# Patient Record
Sex: Female | Born: 1995 | Race: Black or African American | Hispanic: No | Marital: Single | Smoking: Never smoker
Health system: Southern US, Community
[De-identification: ages and names within clinical notes are randomized; demographics above are authoritative.]

## PROBLEM LIST (undated history)

## (undated) DIAGNOSIS — F419 Anxiety disorder, unspecified: Secondary | ICD-10-CM

## (undated) DIAGNOSIS — T7840XA Allergy, unspecified, initial encounter: Secondary | ICD-10-CM

## (undated) DIAGNOSIS — S62609A Fracture of unspecified phalanx of unspecified finger, initial encounter for closed fracture: Secondary | ICD-10-CM

## (undated) DIAGNOSIS — F32A Depression, unspecified: Secondary | ICD-10-CM

## (undated) HISTORY — DX: Allergy, unspecified, initial encounter: T78.40XA

## (undated) HISTORY — DX: Depression, unspecified: F32.A

## (undated) HISTORY — DX: Fracture of unspecified phalanx of unspecified finger, initial encounter for closed fracture: S62.609A

## (undated) HISTORY — DX: Anxiety disorder, unspecified: F41.9

---
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
## 2017-02-24 ENCOUNTER — Encounter: Payer: Self-pay | Admitting: Family Medicine

## 2017-02-24 ENCOUNTER — Ambulatory Visit (INDEPENDENT_AMBULATORY_CARE_PROVIDER_SITE_OTHER): Payer: PRIVATE HEALTH INSURANCE | Admitting: Family Medicine

## 2017-02-24 VITALS — BP 96/60 | HR 72 | Ht 61.0 in | Wt 127.8 lb

## 2017-02-24 DIAGNOSIS — N3001 Acute cystitis with hematuria: Secondary | ICD-10-CM

## 2017-02-24 DIAGNOSIS — R109 Unspecified abdominal pain: Secondary | ICD-10-CM | POA: Diagnosis not present

## 2017-02-24 DIAGNOSIS — N898 Other specified noninflammatory disorders of vagina: Secondary | ICD-10-CM

## 2017-02-24 DIAGNOSIS — N760 Acute vaginitis: Secondary | ICD-10-CM

## 2017-02-24 DIAGNOSIS — B9689 Other specified bacterial agents as the cause of diseases classified elsewhere: Secondary | ICD-10-CM

## 2017-02-24 DIAGNOSIS — Z7251 High risk heterosexual behavior: Secondary | ICD-10-CM

## 2017-02-24 LAB — POCT WET PREP (WET MOUNT): TRICHOMONAS WET PREP HPF POC: ABSENT

## 2017-02-24 LAB — POCT URINALYSIS DIPSTICK
Bilirubin, UA: NEGATIVE
Glucose, UA: NEGATIVE
KETONES UA: NEGATIVE
NITRITE UA: NEGATIVE
SPEC GRAV UA: 1.025 (ref 1.010–1.025)
UROBILINOGEN UA: NEGATIVE U/dL — AB
pH, UA: 6 (ref 5.0–8.0)

## 2017-02-24 MED ORDER — METRONIDAZOLE 0.75 % VA GEL: 1.0000 | Freq: Every day | VAGINAL | 0 refills | Status: DC

## 2017-02-24 MED ORDER — NITROFURANTOIN MONOHYD MACRO 100 MG PO CAPS: 100.0000 mg | ORAL_CAPSULE | Freq: Two times a day (BID) | ORAL | 0 refills | Status: DC

## 2017-02-24 NOTE — Patient Instructions (Addendum)
Take the antibiotic pill twice daily for 5 days.  This is to treat the bladder infection.  If symptoms worsen, please contact us. You should feel significantly better after 2 days of antibiotics. Use the vaginal medication once nightly for 5 nights. If you develop thick, white discharge, with itching, you may be getting a yeast infection from the antibiotic, and let us know.   Make sure you use back-up contraception.  Any antibiotic can interfere with the birth control pills. Condoms are highly recommended anyway, for regular use, for prevention of STD's.   Consider returning for HIV and syphillis tests as part of routine STD testing. You wanted to check with your insurance first.  There are orders in the computer--you would only need to schedule a lab visit.     Bacterial Vaginosis Bacterial vaginosis is a vaginal infection that occurs when the normal balance of bacteria in the vagina is disrupted. It results from an overgrowth of certain bacteria. This is the most common vaginal infection among women ages 65-44. Because bacterial vaginosis increases your risk for STIs (sexually transmitted infections), getting treated can help reduce your risk for chlamydia, gonorrhea, herpes, and HIV (human immunodeficiency virus). Treatment is also important for preventing complications in pregnant women, because this condition can cause an early (premature) delivery. What are the causes? This condition is caused by an increase in harmful bacteria that are normally present in small amounts in the vagina. However, the reason that the condition develops is not fully understood. What increases the risk? The following factors may make you more likely to develop this condition:  Having a new sexual partner or multiple sexual partners.  Having unprotected sex.  Douching.  Having an intrauterine device (IUD).  Smoking.  Drug and alcohol abuse.  Taking certain antibiotic medicines.  Being  pregnant. You cannot get bacterial vaginosis from toilet seats, bedding, swimming pools, or contact with objects around you. What are the signs or symptoms? Symptoms of this condition include:  Grey or white vaginal discharge. The discharge can also be watery or foamy.  A fish-like odor with discharge, especially after sexual intercourse or during menstruation.  Itching in and around the vagina.  Burning or pain with urination. Some women with bacterial vaginosis have no signs or symptoms. How is this diagnosed? This condition is diagnosed based on:  Your medical history.  A physical exam of the vagina.  Testing a sample of vaginal fluid under a microscope to look for a large amount of bad bacteria or abnormal cells. Your health care provider may use a cotton swab or a small wooden spatula to collect the sample. How is this treated? This condition is treated with antibiotics. These may be given as a pill, a vaginal cream, or a medicine that is put into the vagina (suppository). If the condition comes back after treatment, a second round of antibiotics may be needed. Follow these instructions at home: Medicines   Take over-the-counter and prescription medicines only as told by your health care provider.  Take or use your antibiotic as told by your health care provider. Do not stop taking or using the antibiotic even if you start to feel better. General instructions   If you have a female sexual partner, tell her that you have a vaginal infection. She should see her health care provider and be treated if she has symptoms. If you have a female sexual partner, he does not need treatment.  During treatment:  Avoid sexual activity until you finish  treatment.  Do not douche.  Avoid alcohol as directed by your health care provider.  Avoid breastfeeding as directed by your health care provider.  Drink enough water and fluids to keep your urine clear or pale yellow.  Keep the area  around your vagina and rectum clean.  Wash the area daily with warm water.  Wipe yourself from front to back after using the toilet.  Keep all follow-up visits as told by your health care provider. This is important. How is this prevented?  Do not douche.  Wash the outside of your vagina with warm water only.  Use protection when having sex. This includes latex condoms and dental dams.  Limit how many sexual partners you have. To help prevent bacterial vaginosis, it is best to have sex with just one partner (monogamous).  Make sure you and your sexual partner are tested for STIs.  Wear cotton or cotton-lined underwear.  Avoid wearing tight pants and pantyhose, especially during summer.  Limit the amount of alcohol that you drink.  Do not use any products that contain nicotine or tobacco, such as cigarettes and e-cigarettes. If you need help quitting, ask your health care provider.  Do not use illegal drugs. Where to find more information:  Centers for Disease Control and Prevention: SolutionApps.co.zawww.cdc.gov/std  American Sexual Health Association (ASHA): www.ashastd.org  U.S. Department of Health and Health and safety inspectorHuman Services, Office on Women's Health: ConventionalMedicines.siwww.womenshealth.gov/ or http://www.anderson-williamson.info/https://www.womenshealth.gov/a-z-topics/bacterial-vaginosis Contact a health care provider if:  Your symptoms do not improve, even after treatment.  You have more discharge or pain when urinating.  You have a fever.  You have pain in your abdomen.  You have pain during sex.  You have vaginal bleeding between periods. Summary  Bacterial vaginosis is a vaginal infection that occurs when the normal balance of bacteria in the vagina is disrupted.  Because bacterial vaginosis increases your risk for STIs (sexually transmitted infections), getting treated can help reduce your risk for chlamydia, gonorrhea, herpes, and HIV (human immunodeficiency virus). Treatment is also important for preventing complications in pregnant  women, because the condition can cause an early (premature) delivery.  This condition is treated with antibiotic medicines. These may be given as a pill, a vaginal cream, or a medicine that is put into the vagina (suppository). This information is not intended to replace advice given to you by your health care provider. Make sure you discuss any questions you have with your health care provider. Document Released: 10/07/2005 Document Revised: 06/22/2016 Document Reviewed: 06/22/2016 Elsevier Interactive Patient Education  2017 [employer]   Urinary Tract Infection, Adult A urinary tract infection (UTI) is an infection of any part of the urinary tract, which includes the kidneys, ureters, bladder, and urethra. These organs make, store, and get rid of urine in the body. UTI can be a bladder infection (cystitis) or kidney infection (pyelonephritis). What are the causes? This infection may be caused by fungi, viruses, or bacteria. Bacteria are the most common cause of UTIs. This condition can also be caused by repeated incomplete emptying of the bladder during urination. What increases the risk? This condition is more likely to develop if:  You ignore your need to urinate or hold urine for long periods of time.  You do not empty your bladder completely during urination.  You wipe back to front after urinating or having a bowel movement, if you are female.  You are uncircumcised, if you are female.  You are constipated.  You have a urinary catheter that stays in  place (indwelling).  You have a weak defense (immune) system.  You have a medical condition that affects your bowels, kidneys, or bladder.  You have diabetes.  You take antibiotic medicines frequently or for long periods of time, and the antibiotics no longer work well against certain types of infections (antibiotic resistance).  You take medicines that irritate your urinary tract.  You are exposed to chemicals that irritate  your urinary tract.  You are female. What are the signs or symptoms? Symptoms of this condition include:  Fever.  Frequent urination or passing small amounts of urine frequently.  Needing to urinate urgently.  Pain or burning with urination.  Urine that smells bad or unusual.  Cloudy urine.  Pain in the lower abdomen or back.  Trouble urinating.  Blood in the urine.  Vomiting or being less hungry than normal.  Diarrhea or abdominal pain.  Vaginal discharge, if you are female. How is this diagnosed? This condition is diagnosed with a medical history and physical exam. You will also need to provide a urine sample to test your urine. Other tests may be done, including:  Blood tests.  Sexually transmitted disease (STD) testing. If you have had more than one UTI, a cystoscopy or imaging studies may be done to determine the cause of the infections. How is this treated? Treatment for this condition often includes a combination of two or more of the following:  Antibiotic medicine.  Other medicines to treat less common causes of UTI.  Over-the-counter medicines to treat pain.  Drinking enough water to stay hydrated. Follow these instructions at home:  Take over-the-counter and prescription medicines only as told by your health care provider.  If you were prescribed an antibiotic, take it as told by your health care provider. Do not stop taking the antibiotic even if you start to feel better.  Avoid alcohol, caffeine, tea, and carbonated beverages. They can irritate your bladder.  Drink enough fluid to keep your urine clear or pale yellow.  Keep all follow-up visits as told by your health care provider. This is important.  Make sure to:  Empty your bladder often and completely. Do not hold urine for long periods of time.  Empty your bladder before and after sex.  Wipe from front to back after a bowel movement if you are female. Use each tissue one time when you  wipe. Contact a health care provider if:  You have back pain.  You have a fever.  You feel nauseous or vomit.  Your symptoms do not get better after 3 days.  Your symptoms go away and then return. Get help right away if:  You have severe back pain or lower abdominal pain.  You are vomiting and cannot keep down any medicines or water. This information is not intended to replace advice given to you by your health care provider. Make sure you discuss any questions you have with your health care provider. Document Released: 07/17/2005 Document Revised: 03/20/2016 Document Reviewed: 08/28/2015 Elsevier Interactive Patient Education  2017 [employer].

## 2017-02-24 NOTE — Progress Notes (Signed)
Chief Complaint  Patient presents with  . Vaginal Discharge    x 1 week. White in color, fishy smell. And itching. Also thinks she may have a UTI.-sees bllod when she wipes and also having abdominal pain/pressure while urinating.    She presents with complaint of white creamy vaginal discharge for a week or so, more than a normal discharge. There has been a fishy odor, seems similar to prior BV.  She was last treated for BV a few months ago with oral flagyl (from an urgent care). She reports the pill was "hard to get down"--large, and bad taste.   Denies any significant itch, but notes some irritation externally.  She is also complaining of some lower abdominal pressure when voiding.  She noticed some blood when she wiped today, but didn't see blood in the urine in the toilet.  Denies burning with urination.  She is having some urgency and frequency.  Feels similar to prior UTI--treated with bactrim in 06/2016  Sexually active--with current partner x 6 months.  On OCP's, doesn't use condoms.  Has not had STD testing since this relationship started.  PMH, PSH, SH reviewed and updated  Current Outpatient Prescriptions on File Prior to Visit  Medication Sig Dispense Refill  . norethindrone-ethinyl estradiol (LOESTRIN FE 1/20) 1-20 MG-MCG tablet Take 1 tablet by mouth daily. 1 Package 11   No current facility-administered medications on file prior to visit.    No Known Allergies  ROS: no fever, chills, headaches, URI or allergy symptoms, chest pain, shortness of breath, GI complaints, rash or other complaints, except as noted in HPI  PHYSICAL EXAM: BP 96/60 (BP Location: Left Arm, Patient Position: Sitting, Cuff Size: Normal)   Pulse 72   Ht 5\' 1"  (1.549 m)   Wt 127 lb 12.8 oz (58 kg)   LMP 02/11/2017   BMI 24.15 kg/m   Well developed, pleasant female in no distress Heart: regular rate and rhythm Lungs: clear bilaterally Back: no CVA tenderness Abdomen: soft, nontender, no  organomegaly or mass GU: normal external genitalia. BUS normal.  Cervix appears normal, without lesions.  Mild-mod amount of thin white discharge present, some thicker discharge noted more proximally. No cervical motion tenderness.  Uterus is normal. Minimal tenderness at right adnexa, no mass, guarding Psych: normal mood, affect, hygiene and grooming Neuro: alert and oriented, cranial nerves, strength and gait normal.  Urine dip: SG 1.025; 3+ blood, 1+ protein, 2+ blood Wet prep-- many squams, rare clue, no yeast.   ASSESSMENT/PLAN:  Vaginal discharge - only few clue cells, but history is consistent--treat for BV. Risks/side effects reviewed. No other infection noted. Condom use recommended - Plan: POCT Wet Prep ([place]et [place]), GC/Chlamydia Probe Amp  Abdominal pressure - Plan: POCT Urinalysis Dipstick  Unprotected sexual intercourse - return for blood portion of STD screen (wants to check insurance first)--strongly encouraged condom use - Plan: GC/Chlamydia Probe Amp, RPR, HIV antibody  Acute cystitis with hematuria - treat with Macrobid. back up contraception discussed. Call if not improving - Plan: Urine culture, nitrofurantoin, macrocrystal-monohydrate, (MACROBID) 100 MG capsule  Bacterial vaginosis - Plan: metroNIDAZOLE (METROGEL VAGINAL) 0.75 % vaginal gel  HIV and RPR recommended--declines today, will check to ensure that it is covered (no deductible), and return for lab visit if covered.  Counseled re: condom use, risks of unprotected sex. Counseled re: risks/side effects of both prescribed medications.

## 2017-02-25 LAB — GC/CHLAMYDIA PROBE AMP
CT Probe RNA: NOT DETECTED
GC Probe RNA: NOT DETECTED

## 2017-02-26 LAB — URINE CULTURE

## 2017-05-27 ENCOUNTER — Other Ambulatory Visit: Payer: Self-pay | Admitting: Family Medicine

## 2017-05-27 DIAGNOSIS — N760 Acute vaginitis: Principal | ICD-10-CM

## 2017-05-27 DIAGNOSIS — B9689 Other specified bacterial agents as the cause of diseases classified elsewhere: Secondary | ICD-10-CM

## 2017-05-27 NOTE — Telephone Encounter (Signed)
Noted, one was ignored.  Please offer OV. She was to return for blood work (HIV/RPR) after verifying insurance coverage, but never returned.  Have her check on this, and we can schedule OV to address her concerns, and do bloodwork

## 2017-05-27 NOTE — Telephone Encounter (Signed)
Pt called and stated that she accidentally sent in request twice.

## 2017-06-23 ENCOUNTER — Other Ambulatory Visit: Payer: Self-pay | Admitting: Family Medicine

## 2017-06-23 DIAGNOSIS — Z3009 Encounter for other general counseling and advice on contraception: Secondary | ICD-10-CM

## 2017-06-24 NOTE — Telephone Encounter (Signed)
Left message for pt to call me back or reply to [employer] [facility] message

## 2017-07-03 ENCOUNTER — Telehealth: Payer: Self-pay | Admitting: Family Medicine

## 2017-07-03 DIAGNOSIS — Z3009 Encounter for other general counseling and advice on contraception: Secondary | ICD-10-CM

## 2017-07-03 MED ORDER — NORETHIN ACE-ETH ESTRAD-FE 1-20 MG-MCG PO TABS: 1.0000 | ORAL_TABLET | Freq: Every day | ORAL | 0 refills | Status: DC

## 2017-07-03 NOTE — Telephone Encounter (Signed)
She is my pt and can be a second PE (as long as just CPE, and not a list of concerns/issues also to be addressed).  Can send to nurse to refill OCP for just enough packs to get her to the date of physical

## 2017-07-03 NOTE — Telephone Encounter (Signed)
Done

## 2017-07-03 NOTE — Telephone Encounter (Signed)
Pt called and is needing a cpe,to refill her birth control pills  I am just wanting to make sure she is your pt, she had one with you 09-18-2015 and canceled, and she canceled her appt with the obyn, she can be reached at [phone]  Can she be added as one of your second cpe of the day

## 2017-07-03 NOTE — Telephone Encounter (Signed)
Pt is coming in sept 26th for cpe, she does need refill on her birth control she does not have any, states the names of the rx is junel FE 1/20 pt uses [facility] on [address]

## 2017-07-11 ENCOUNTER — Encounter: Payer: Self-pay | Admitting: Medical

## 2017-07-11 ENCOUNTER — Ambulatory Visit (INDEPENDENT_AMBULATORY_CARE_PROVIDER_SITE_OTHER): Payer: PRIVATE HEALTH INSURANCE | Admitting: Medical

## 2017-07-11 VITALS — BP 110/68 | HR 89 | Temp 98.8°F | Wt 122.0 lb

## 2017-07-11 DIAGNOSIS — R102 Pelvic and perineal pain unspecified side: Secondary | ICD-10-CM

## 2017-07-11 DIAGNOSIS — M545 Low back pain, unspecified: Secondary | ICD-10-CM

## 2017-07-11 DIAGNOSIS — N946 Dysmenorrhea, unspecified: Secondary | ICD-10-CM

## 2017-07-11 DIAGNOSIS — Z113 Encounter for screening for infections with a predominantly sexual mode of transmission: Secondary | ICD-10-CM

## 2017-07-11 DIAGNOSIS — N926 Irregular menstruation, unspecified: Secondary | ICD-10-CM | POA: Diagnosis not present

## 2017-07-11 LAB — POCT URINALYSIS DIP (PROADVANTAGE DEVICE)
BILIRUBIN UA: NEGATIVE
BILIRUBIN UA: NEGATIVE mg/dL
Blood, UA: NEGATIVE
Glucose, UA: NEGATIVE mg/dL
Leukocytes, UA: NEGATIVE
Nitrite, UA: NEGATIVE
PH UA: 6 (ref 5.0–8.0)
PROTEIN UA: NEGATIVE mg/dL
SPECIFIC GRAVITY, URINE: 1.03
Urobilinogen, Ur: NEGATIVE

## 2017-07-11 LAB — POCT URINE PREGNANCY: Preg Test, Ur: NEGATIVE

## 2017-07-11 MED ORDER — IBUPROFEN 600 MG PO TABS: 600.0000 mg | ORAL_TABLET | Freq: Three times a day (TID) | ORAL | 0 refills | Status: DC | PRN

## 2017-07-11 NOTE — Progress Notes (Signed)
Subjective: Chief Complaint  Patient presents with  . Abdominal Cramping    bleeding and cramping ,last period was 05/26/2017   Here for bleeding, cramping.  LMP she thinks was 05/26/17. She notes that she has always had a heavy flow, but summer and earlier in the year was having normal periods, no unusual symptoms.   However, 4 days ago Tuesday had heavy bleeding, bad cramping, low back pain.   4 days ago saw a large bloody blob in the toilet.  This month hasn't taken her birth control given unusual bleeding this month.  She does note missing doses of birth control periodically, more often than she should.  Certainly has missed some days the past month.    Sexually active, not using condoms.   No prior pregnancy.   No fever.   Has had some breast tenderness towards end of August.  No burning with urination.  Has been having some urinary frequency.  No blood in urine.   Has had some vaginal discharge early September first few weeks of this months.  Has had same partner 6 mo.   Last STD test " a while."  No bleeding so far today, yesterday had some discharge.  Still having some back pain and pelvic cramping today.  No problems with bowels.   No other aggravating or relieving factors. No other complaint.  Past Medical History:  Diagnosis Date  . Allergy   . Finger fracture, right 2012   right index, splinted   Current Outpatient Prescriptions on File Prior to Visit  Medication Sig Dispense Refill  . nitrofurantoin, macrocrystal-monohydrate, (MACROBID) 100 MG capsule Take 1 capsule (100 mg total) by mouth 2 (two) times daily. (Patient not taking: Reported on 07/11/2017) 10 capsule 0  . norethindrone-ethinyl estradiol (LOESTRIN FE 1/20) 1-20 MG-MCG tablet Take 1 tablet by mouth daily. (Patient not taking: Reported on 07/11/2017) 1 Package 0   No current facility-administered medications on file prior to visit.    ROS as in subjective   Objective: BP 110/68   Pulse 89   Temp 98.8 F (37.1 C)   Wt  122 lb (55.3 kg)   LMP 05/26/2017   BMI 23.05 kg/m  . Wt Readings from Last 3 Encounters:  07/11/17 122 lb (55.3 kg) (38 %, Z= -0.29)*  02/24/17 127 lb 12.8 oz (58 kg) (51 %, Z= 0.03)*  08/26/16 127 lb (57.6 kg) (52 %, Z= 0.06)*   * Growth percentiles are based on CDC 2-20 Years data.   General appearance: alert, no distress, WD/WN,  Oral cavity: MMM, no lesions Neck: supple, no lymphadenopathy, no thyromegaly, no masses Heart: RRR, normal S1, S2, no murmurs Lungs: CTA bilaterally, no wheezes, rhonchi, or rales Abdomen: +bs, soft, generalized pelvic tenderness, otherwise non tender, non distended, no masses, no hepatomegaly, no splenomegaly Pulses: 2+ symmetric, upper and lower extremities, normal cap refill Ext: no edema   Gyn: Normal external genitalia without lesions, vagina with normal mucosa, cervix with os slightly open, mild amount of erythema of cervix circumferential around os, no cervical motion tenderness, no abnormal vaginal discharge.  Uterus possibly mildly enlarged, tender over uterus and bilat adnexa, no masses.  Swab taken.  Exam chaperoned by nurse.    Assessment: Encounter Diagnoses  Name Primary?  . Missed period Yes  . Dysmenorrhea   . Screen for STD (sexually transmitted disease)   . Pelvic pain   . Low back pain without sciatica, unspecified back pain laterality, unspecified chronicity  Plan: Discussed symptoms, concerns, possible causes.  Labs today to evaluate.  Over the weekend use ibuprofen, rest, hydrate well, and f/u pending labs.   C/t plan to see Dr. Lynelle Doctor soon as scheduled.   Plan to discus other options for contraception for better compliance.     Jonique was seen today for abdominal cramping.  Diagnoses and all orders for this visit:  Missed period -     CBC with Differential/Platelet -     TSH -     hCG, quantitative, pregnancy -     HIV antibody -     RPR -     C. trachomatis/N. gonorrhoeae RNA -     POCT urine  pregnancy  Dysmenorrhea -     CBC with Differential/Platelet -     TSH -     hCG, quantitative, pregnancy -     HIV antibody -     RPR -     C. trachomatis/N. gonorrhoeae RNA  Screen for STD (sexually transmitted disease) -     CBC with Differential/Platelet -     TSH -     hCG, quantitative, pregnancy -     HIV antibody -     RPR -     C. trachomatis/N. gonorrhoeae RNA -     POCT Urinalysis DIP (Proadvantage Device)  Pelvic pain -     CBC with Differential/Platelet -     TSH -     hCG, quantitative, pregnancy -     HIV antibody -     RPR -     C. trachomatis/N. gonorrhoeae RNA  Low back pain without sciatica, unspecified back pain laterality, unspecified chronicity -     CBC with Differential/Platelet -     TSH -     hCG, quantitative, pregnancy -     HIV antibody -     RPR -     C. trachomatis/N. gonorrhoeae RNA  Other orders -     ibuprofen (ADVIL,MOTRIN) 600 MG tablet; Take 1 tablet (600 mg total) by mouth every 8 (eight) hours as needed.

## 2017-07-12 LAB — CBC WITH DIFFERENTIAL/PLATELET
BASOS PCT: 0.4 %
Basophils Absolute: 29 cells/uL (ref 0–200)
Eosinophils Absolute: 58 cells/uL (ref 15–500)
Eosinophils Relative: 0.8 %
HCT: 36.5 % (ref 35.0–45.0)
Hemoglobin: 12.3 g/dL (ref 11.7–15.5)
Lymphs Abs: 1750 cells/uL (ref 850–3900)
MCH: 30.3 pg (ref 27.0–33.0)
MCHC: 33.7 g/dL (ref 32.0–36.0)
MCV: 89.9 fL (ref 80.0–100.0)
MPV: 10.1 fL (ref 7.5–12.5)
Monocytes Relative: 5.3 %
NEUTROS PCT: 69.2 %
Neutro Abs: 4982 cells/uL (ref 1500–7800)
Platelets: 361 10*3/uL (ref 140–400)
RBC: 4.06 10*6/uL (ref 3.80–5.10)
RDW: 12.1 % (ref 11.0–15.0)
TOTAL LYMPHOCYTE: 24.3 %
WBC mixed population: 382 cells/uL (ref 200–950)
WBC: 7.2 10*3/uL (ref 3.8–10.8)

## 2017-07-12 LAB — RPR: RPR: NONREACTIVE

## 2017-07-12 LAB — HCG, QUANTITATIVE, PREGNANCY: HCG, Total, QN: <2 m[IU]/mL

## 2017-07-12 LAB — HIV ANTIBODY (ROUTINE TESTING W REFLEX): HIV: NONREACTIVE

## 2017-07-12 LAB — TSH: TSH: 0.66 mIU/L

## 2017-07-14 LAB — C. TRACHOMATIS/N. GONORRHOEAE RNA
C. trachomatis RNA, TMA: NOT DETECTED
N. GONORRHOEAE RNA, TMA: NOT DETECTED

## 2017-07-15 NOTE — Progress Notes (Deleted)
Kelli Norton is a 21 y.o. female who presents for a complete physical.  She has the following concerns:  Patient was seen last week by Audelia Acton with complaint of abdominal pain/cramping and vaginal bleeding. Per his note: LMP she thinks was 05/26/17. She notes that she has always had a heavy flow, but summer and earlier in the year was having normal periods, no unusual symptoms.   Last Tuesday she had heavy bleeding, bad cramping, low back pain, saw a large bloody blob in the toilet.  She stopped taking her birth control given unusual bleeding this month.  She does note missing doses of birth control periodically, more often than she should. She is sexually active, not using condoms.  She had negative STD check, as well as normal CBC and TSH (with labs done last week).  Serum hcg was negative.  She previously (06/2016) had been referred to GYN to discuss other contraceptive options, but she canceled that appointment   Immunization History  Administered Date(s) Administered  . DTaP 01/11/1998, 03/30/1998, 06/01/1998, 02/08/1999, 11/17/2001  . DTaP / Hep B / IPV 02/08/1999, 05/25/1999  . HPV 9-valent 06/22/2015  . Hepatitis A 06/06/2009, 02/27/2011  . Hepatitis B 11-06-1997, 12/09/1997, 08/09/1998  . HiB (PRP-OMP) 01/11/1998, 03/30/1998, 02/08/1999  . IPV 01/11/1998, 03/30/1998, 08/09/1998, 11/17/2001  . MMR 11/23/1998, 11/17/2001  . Meningococcal B, OMV 06/22/2015  . Meningococcal Conjugate 06/22/2015  . Pneumococcal Conjugate-13 02/08/1999, 05/25/1999, 11/17/2001  . Tdap 06/06/2009  . Varicella 11/25/1998, 06/06/2009   She never returned for physical last year, didn't finish HPV series or get 2nd Bexsero Last Pap smear: never Dentist: Ophtho: Exercise: Lipid screen: Lab Results  Component Value Date   CHOL 124 (L) 06/22/2015   HDL 47 06/22/2015   LDLCALC 69 06/22/2015   TRIG 38 (L) 06/22/2015   CHOLHDL 2.6 06/22/2015   Screen for anemia: Lab Results  Component Value Date   WBC 7.2 07/11/2017   HGB 12.3 07/11/2017   HCT 36.5 07/11/2017   MCV 89.9 07/11/2017   PLT 361 07/11/2017       ROS: The patient denies anorexia, fever, weight changes, headaches,  vision changes, decreased hearing, ear pain, sore throat, breast concerns, chest pain, palpitations, dizziness, syncope, dyspnea on exertion, cough, swelling, nausea, vomiting, diarrhea, constipation, abdominal pain, melena, hematochezia, indigestion/heartburn, hematuria, incontinence, dysuria, vaginal discharge, odor or itch, genital lesions, joint pains, numbness, tingling, weakness, tremor, suspicious skin lesions, depression, anxiety, abnormal bleeding/bruising, or enlarged lymph nodes. Irregular menses and cramping per HPI, resolved   PHYSICAL EXAM:  Wt Readings from Last 3 Encounters:  07/11/17 122 lb (55.3 kg) (38 %, Z= -0.29)*  02/24/17 127 lb 12.8 oz (58 kg) (51 %, Z= 0.03)*  08/26/16 127 lb (57.6 kg) (52 %, Z= 0.06)*   * Growth percentiles are based on CDC 2-20 Years data.    There were no vitals taken for this visit.  General Appearance:    Alert, cooperative, no distress, appears stated age  Head:    Normocephalic, without obvious abnormality, atraumatic  Eyes:    PERRL, conjunctiva/corneas clear, EOM's intact, fundi    benign  Ears:    Normal TM's and external ear canals  Nose:   Nares normal, mucosa normal, no drainage or sinus   tenderness  Throat:   Lips, mucosa, and tongue normal; teeth and gums normal  Neck:   Supple, no lymphadenopathy;  thyroid:  no   enlargement/tenderness/nodules; no carotid   bruit or JVD  Back:    Spine  nontender, no curvature, ROM normal, no CVA     tenderness  Lungs:     Clear to auscultation bilaterally without wheezes, rales or     ronchi; respirations unlabored  Chest Wall:    No tenderness or deformity   Heart:    Regular rate and rhythm, S1 and S2 normal, no murmur, rub   or gallop  Breast Exam:    No tenderness, masses, or nipple discharge or  inversion.      No axillary lymphadenopathy  Abdomen:     Soft, non-tender, nondistended, normoactive bowel sounds,    no masses, no hepatosplenomegaly  Genitalia:    Normal external genitalia without lesions.  BUS and vagina normal; cervix without lesions, or cervical motion tenderness. No abnormal vaginal discharge.  Uterus and adnexa not enlarged, nontender, no masses.  Pap not performed  Rectal:    Not performed due to age<40 and no related complaints  Extremities:   No clubbing, cyanosis or edema  Pulses:   2+ and symmetric all extremities  Skin:   Skin color, texture, turgor normal, no rashes or lesions  Lymph nodes:   Cervical, supraclavicular, and axillary nodes normal  Neurologic:   CNII-XII intact, normal strength, sensation and gait; reflexes 2+ and symmetric throughout          Psych:   Normal mood, affect, hygiene and grooming.      ASSESSMENT/PLAN:   Flu shot HPV ?Bexsero?

## 2017-07-16 ENCOUNTER — Telehealth: Payer: Self-pay | Admitting: *Deleted

## 2017-07-16 ENCOUNTER — Encounter: Payer: PRIVATE HEALTH INSURANCE | Admitting: Family Medicine

## 2017-07-16 NOTE — Telephone Encounter (Signed)
I worked her in as an extra physical due to her needing her birth control pills refilled.  I can see her on 10/3 or 10/4 as a second CPE--if these times do not work for her, she can schedule a visit with a GYN (she was referred last year, and canceled her appointment there). She should check in advance that these work for her schedule.  She will be billed $50 if she has another same day cancellation for a physical.  (FYI--I had asked MB to cancel her 2:30 10/3 appt when checked out, and she is still on the schedule...)

## 2017-07-16 NOTE — Telephone Encounter (Signed)
Patient called at 11:30 to cancel her 1:45 CPE. Her professor told her she had to come to class and could not miss and she could not come to her physical. She was taken off the schedule when she canceled, the front office said they were trying to open up the spot(which makes sense) so I am sending this as a phone call with the no show questions.  This patient no showed for their appointment today.Which of the following is necessary for this patient.   A) No follow-up necessary   B) Follow-up urgent. Locate Patient Immediately.   C) Follow-up necessary. Contact patient and Schedule visit in ____ Days.   D) Follow-up Advised. Contact patient and Schedule visit in ____ Days.  E) Please Send no show letter to patient. Charge no show fee if no show was a CPE.

## 2017-07-16 NOTE — Telephone Encounter (Signed)
Patient rescheduled for 07/23/17 @ 2:45pm

## 2017-07-19 ENCOUNTER — Encounter: Payer: Self-pay | Admitting: Family Medicine

## 2017-07-23 ENCOUNTER — Encounter: Payer: Self-pay | Admitting: Family Medicine

## 2017-07-31 ENCOUNTER — Other Ambulatory Visit: Payer: Self-pay | Admitting: Family Medicine

## 2017-07-31 DIAGNOSIS — Z3009 Encounter for other general counseling and advice on contraception: Secondary | ICD-10-CM

## 2017-07-31 NOTE — Telephone Encounter (Signed)
Should I fill one more month? She is scheduled for 10/24.

## 2017-08-13 ENCOUNTER — Encounter: Payer: Self-pay | Admitting: Family Medicine

## 2018-05-18 ENCOUNTER — Encounter: Payer: Self-pay | Admitting: Family Medicine

## 2018-05-20 ENCOUNTER — Ambulatory Visit (INDEPENDENT_AMBULATORY_CARE_PROVIDER_SITE_OTHER): Payer: BLUE CROSS/BLUE SHIELD | Admitting: Family Medicine

## 2018-05-20 ENCOUNTER — Encounter: Payer: Self-pay | Admitting: Family Medicine

## 2018-05-20 VITALS — BP 100/62 | HR 76 | Ht 61.0 in | Wt 123.0 lb

## 2018-05-20 DIAGNOSIS — B373 Candidiasis of vulva and vagina: Secondary | ICD-10-CM

## 2018-05-20 DIAGNOSIS — Z3009 Encounter for other general counseling and advice on contraception: Secondary | ICD-10-CM

## 2018-05-20 DIAGNOSIS — Z23 Encounter for immunization: Secondary | ICD-10-CM

## 2018-05-20 DIAGNOSIS — B3731 Acute candidiasis of vulva and vagina: Secondary | ICD-10-CM

## 2018-05-20 DIAGNOSIS — N898 Other specified noninflammatory disorders of vagina: Secondary | ICD-10-CM | POA: Diagnosis not present

## 2018-05-20 LAB — POCT WET PREP (WET MOUNT): Trichomonas Wet Prep HPF POC: ABSENT

## 2018-05-20 MED ORDER — FLUCONAZOLE 150 MG PO TABS: 150.0000 mg | ORAL_TABLET | Freq: Once | ORAL | 0 refills | Status: AC

## 2018-05-20 NOTE — Progress Notes (Signed)
Chief Complaint  Patient presents with  . Vaginal Discharge    that is white and itchy x 3 days. Thought she had one last week and Monistat ointment and thinks it didn't really work and has come back.   . Contraception    if you have time would like to discuss.    Patient presents with complaint of yeast infection.  She tried Monistat 1-2 weeks ago, symptoms resolved, but recurred a couple of days ago. She has itching, white discharge, thick.  Denies, odor, pain.  No recent antibiotics.    Unprotected intercourse on 7/26, took Plan B on 7/27 New partner, been with him x 2 months, was first time having sexual relations with him. 3 total lifetime partners.  Previously took OCP's, had trouble remembering to take pills, would like to discuss contraceptive options.    PMH, PSH, SH reviewed  Outpatient Encounter Medications as of 05/20/2018  Medication Sig  . fluconazole (DIFLUCAN) 150 MG tablet Take 1 tablet (150 mg total) by mouth once for 1 dose. Take 1 tablet by mouth for yeast infection.  Repeat in 1 week if needed  . ibuprofen (ADVIL,MOTRIN) 600 MG tablet Take 1 tablet (600 mg total) by mouth every 8 (eight) hours as needed. (Patient not taking: Reported on 05/20/2018)  . JUNEL FE 1/20 1-20 MG-MCG tablet TAKE 1 TABLET BY MOUTH EVERY DAY (Patient not taking: Reported on 05/20/2018)  . [DISCONTINUED] nitrofurantoin, macrocrystal-monohydrate, (MACROBID) 100 MG capsule Take 1 capsule (100 mg total) by mouth 2 (two) times daily. (Patient not taking: Reported on 07/11/2017)   No facility-administered encounter medications on file as of 05/20/2018.    (not currently taking OCP's; diflucan rx'd today, not taking prior to visit).  No Known Allergies  ROS: no fever, chills, pelvic pain, urinary complaints, URI symptoms, chest pain, shortness of breath, leg swelling or other problems  PHYSICAL EXAM:  BP 100/62   Pulse 76   Ht 5\' 1"  (1.549 m)   Wt 123 lb (55.8 kg)   LMP 04/26/2018   BMI 23.24  kg/m   Well appearing, pleasant female in no distress Abdomen: soft, nontender External genitalia: normal, no lesions. Speculum exam reveals large amount of thick white, cottage-cheesy-like discharge. Cervix appears normal, no drainage.  Wet prep: hyphae noted.  Otherwise normal  ASSESSMENT/PLAN:  Yeast vaginitis - treat with diflucan, repeat in 1 week if needed; if not improved, to try Monistat - Plan: fluconazole (DIFLUCAN) 150 MG tablet  Vaginal discharge - c/w yeast vaginitis. +unprotected sex, screen for STD; declines bloodwork today, will do at CPE - Plan: POCT Wet Prep [facility]), GC/Chlamydia Probe Amp  Need for HPV vaccination - only had 1st shot at her last physical; agreeable to complete series Risks/SE reviewed. - Plan: HPV 9-valent vaccine,Recombinat  Birth control counseling - discussed OCP's, implant, IUD, Depo Provera, nuva ring, patches. Plans to restart OCP's (reminded condoms needed first mo) and consider options    GC/chlamydia HPV #2 today.  CPE in Jan-Feb (will need Tdap, HPV #3) Will be due for pap smear (will be 23 then).   Please use condoms all the time to prevent STDs as well as pregnancy. You can continue to use Plan B if needed for unprotected sex, to prevent pregnancy, but this is emergency contraception, not to be used regularly for contraception. We discussed pills, patches, Nuva Ring and progesterone-only birth control methods, including Depo Provera shots, implants (ie nexplanon), and IUD's.  The last 2 would come from a GYN's office,  so if you are interested in an IUD or implant, you would need to consult with a gynecologist.  Return in Jan/Feb for a physical--at that time you will need a pap smear and probably blood work as well.

## 2018-05-20 NOTE — Patient Instructions (Addendum)
Take diflucan for yeast infection today.  If not 100% better, take the second pill next week. If symptoms don't resolve, use Monistat instead.  Please use condoms all the time to prevent STDs as well as pregnancy. You can continue to use Plan B if needed for unprotected sex, to prevent pregnancy, but this is emergency contraception, not to be used regularly for contraception. We discussed pills, patches, Nuva Ring and progesterone-only birth control methods, including Depo Provera shots, implants (ie nexplanon), and IUD's.  The last 2 would come from a GYN's office, so if you are interested in an IUD or implant, you would need to consult with a gynecologist.  Return in Jan/Feb for a physical--at that time you will need a pap smear and probably blood work as well. We will finish the HPV series then as well (you got your 2nd shot today, and will get your third at the physical)  Contraception Choices Contraception, also called birth control, refers to methods or devices that prevent pregnancy. Hormonal methods Contraceptive implant A contraceptive implant is a thin, plastic tube that contains a hormone. It is inserted into the upper part of the arm. It can remain in place for up to 3 years. Progestin-only injections Progestin-only injections are injections of progestin, a synthetic form of the hormone progesterone. They are given every 3 months by a health care provider. Birth control pills Birth control pills are pills that contain hormones that prevent pregnancy. They must be taken once a day, preferably at the same time each day. Birth control patch The birth control patch contains hormones that prevent pregnancy. It is placed on the skin and must be changed once a week for three weeks and removed on the fourth week. A prescription is needed to use this method of contraception. Vaginal ring A vaginal ring contains hormones that prevent pregnancy. It is placed in the vagina for three weeks and  removed on the fourth week. After that, the process is repeated with a new ring. A prescription is needed to use this method of contraception. Emergency contraceptive Emergency contraceptives prevent pregnancy after unprotected sex. They come in pill form and can be taken up to 5 days after sex. They work best the sooner they are taken after having sex. Most emergency contraceptives are available without a prescription. This method should not be used as your only form of birth control. Barrier methods Female condom A female condom is a thin sheath that is worn over the penis during sex. Condoms keep sperm from going inside a woman's body. They can be used with a spermicide to increase their effectiveness. They should be disposed after a single use. Female condom A female condom is a soft, loose-fitting sheath that is put into the vagina before sex. The condom keeps sperm from going inside a woman's body. They should be disposed after a single use. Diaphragm A diaphragm is a soft, dome-shaped barrier. It is inserted into the vagina before sex, along with a spermicide. The diaphragm blocks sperm from entering the uterus, and the spermicide kills sperm. A diaphragm should be left in the vagina for 6-8 hours after sex and removed within 24 hours. A diaphragm is prescribed and fitted by a health care provider. A diaphragm should be replaced every 1-2 years, after giving birth, after gaining more than 15 lb (6.8 kg), and after pelvic surgery. Cervical cap A cervical cap is a round, soft latex or plastic cup that fits over the cervix. It is inserted into the  vagina before sex, along with spermicide. It blocks sperm from entering the uterus. The cap should be left in place for 6-8 hours after sex and removed within 48 hours. A cervical cap must be prescribed and fitted by a health care provider. It should be replaced every 2 years. Sponge A sponge is a soft, circular piece of polyurethane foam with spermicide on  it. The sponge helps block sperm from entering the uterus, and the spermicide kills sperm. To use it, you make it wet and then insert it into the vagina. It should be inserted before sex, left in for at least 6 hours after sex, and removed and thrown away within 30 hours. Spermicides Spermicides are chemicals that kill or block sperm from entering the cervix and uterus. They can come as a cream, jelly, suppository, foam, or tablet. A spermicide should be inserted into the vagina with an applicator at least 10-15 minutes before sex to allow time for it to work. The process must be repeated every time you have sex. Spermicides do not require a prescription. Intrauterine contraception Intrauterine device (IUD) An IUD is a T-shaped device that is put in a woman's uterus. There are two types:  Hormone IUD.This type contains progestin, a synthetic form of the hormone progesterone. This type can stay in place for 3-5 years.  Copper IUD.This type is wrapped in copper wire. It can stay in place for 10 years.  Permanent methods of contraception Female tubal ligation In this method, a woman's fallopian tubes are sealed, tied, or blocked during surgery to prevent eggs from traveling to the uterus. Hysteroscopic sterilization In this method, a small, flexible insert is placed into each fallopian tube. The inserts cause scar tissue to form in the fallopian tubes and block them, so sperm cannot reach an egg. The procedure takes about 3 months to be effective. Another form of birth control must be used during those 3 months. Female sterilization This is a procedure to tie off the tubes that carry sperm (vasectomy). After the procedure, the man can still ejaculate fluid (semen). Natural planning methods Natural family planning In this method, a couple does not have sex on days when the woman could become pregnant. Calendar method This means keeping track of the length of each menstrual cycle, identifying the days  when pregnancy can happen, and not having sex on those days. Ovulation method In this method, a couple avoids sex during ovulation. Symptothermal method This method involves not having sex during ovulation. The woman typically checks for ovulation by watching changes in her temperature and in the consistency of cervical mucus. Post-ovulation method In this method, a couple waits to have sex until after ovulation. Summary  Contraception, also called birth control, means methods or devices that prevent pregnancy.  Hormonal methods of contraception include implants, injections, pills, patches, vaginal rings, and emergency contraceptives.  Barrier methods of contraception can include female condoms, female condoms, diaphragms, cervical caps, sponges, and spermicides.  There are two types of IUDs (intrauterine devices). An IUD can be put in a woman's uterus to prevent pregnancy for 3-5 years.  Permanent sterilization can be done through a procedure for males, females, or both.  Natural family planning methods involve not having sex on days when the woman could become pregnant. This information is not intended to replace advice given to you by your health care provider. Make sure you discuss any questions you have with your health care provider. Document Released: 10/07/2005 Document Revised: 11/09/2016 Document Reviewed: 11/09/2016 Elsevier  Interactive Patient Education  [employer].

## 2018-05-22 LAB — GC/CHLAMYDIA PROBE AMP
CHLAMYDIA, DNA PROBE: NEGATIVE
NEISSERIA GONORRHOEAE BY PCR: NEGATIVE

## 2018-06-24 ENCOUNTER — Other Ambulatory Visit: Payer: Self-pay | Admitting: Family Medicine

## 2018-06-24 DIAGNOSIS — Z3009 Encounter for other general counseling and advice on contraception: Secondary | ICD-10-CM

## 2018-06-24 NOTE — Telephone Encounter (Signed)
Is this okay to refill until CPE in Jan? Hasn't ben sent in since 2018 but looks like she was going to restart when you saw her in July, so I am assuming she had a pack at home?

## 2018-08-10 ENCOUNTER — Other Ambulatory Visit: Payer: Self-pay | Admitting: Family Medicine

## 2018-08-10 DIAGNOSIS — Z3009 Encounter for other general counseling and advice on contraception: Secondary | ICD-10-CM

## 2018-09-07 ENCOUNTER — Other Ambulatory Visit: Payer: Self-pay | Admitting: Family Medicine

## 2018-09-07 DIAGNOSIS — Z3009 Encounter for other general counseling and advice on contraception: Secondary | ICD-10-CM

## 2018-10-14 ENCOUNTER — Other Ambulatory Visit: Payer: Self-pay | Admitting: Family Medicine

## 2018-10-14 DIAGNOSIS — Z3009 Encounter for other general counseling and advice on contraception: Secondary | ICD-10-CM

## 2018-11-14 ENCOUNTER — Other Ambulatory Visit: Payer: Self-pay | Admitting: Family Medicine

## 2018-11-14 DIAGNOSIS — Z3009 Encounter for other general counseling and advice on contraception: Secondary | ICD-10-CM

## 2018-11-16 MED ORDER — NORETHIN ACE-ETH ESTRAD-FE 1-20 MG-MCG PO TABS: 1.0000 | ORAL_TABLET | Freq: Every day | ORAL | 1 refills | Status: DC

## 2018-11-16 NOTE — Addendum Note (Signed)
Addended by: Herminio Commons A on: 11/16/2018 03:34 PM   Modules accepted: Orders

## 2018-11-16 NOTE — Telephone Encounter (Signed)
Ok to refill 2 packs only, which should lasts until her GYN appt

## 2018-11-16 NOTE — Telephone Encounter (Signed)
Spoke with patient and she would like to know if you would fill this one last time. She is going to see Dr. Cherly Hensen in 01/05/19 and she will have her fill after she has her pap there. She is still going to come to you for PCP.

## 2018-11-19 ENCOUNTER — Encounter: Payer: BLUE CROSS/BLUE SHIELD | Admitting: Family Medicine

## 2018-11-21 ENCOUNTER — Telehealth: Payer: Self-pay | Admitting: Family Medicine

## 2018-11-21 NOTE — Telephone Encounter (Signed)
P.A. JUNEL FE

## 2018-12-22 NOTE — Telephone Encounter (Signed)
Called pharmacy and they are getting unusual rejection for fill too soon.  Called pt and she hasn't gotten them filled and she wants to switch to another BCP.  Appt made to discuss options

## 2019-01-03 NOTE — Progress Notes (Deleted)
  She was last seen in 04/2018, at which time she had a yeast infection.  She had had unprotected sexual intercourse.  Screening for GC/Chlamydia was done, but she declined bloodwork deferring to her physical, which she ultimately canceled.  She is also past due for her third HPV (which was due to be given at that physical visit).  She is now 23 and due for pap smear. She has only been seen twice in the last couple of years, for acute visits only.  We did discuss OCP's at her July visit--discussed OCP's, implant, IUD, Depo Provera, nuva ring, patches. She elected to restart OCP's,while considering other options.  She was given 6 mos of rx, to last until her physical, which she canceled, which is why she is here today.   Immunization History  Administered Date(s) Administered  . DTaP 01/11/1998, 03/30/1998, 06/01/1998, 02/08/1999, 11/17/2001  . DTaP / Hep B / IPV 02/08/1999, 05/25/1999  . HPV 9-valent 06/22/2015, 05/20/2018  . Hepatitis A 06/06/2009, 02/27/2011  . Hepatitis B 1997/11/21, 12/09/1997, 08/09/1998  . HiB (PRP-OMP) 01/11/1998, 03/30/1998, 02/08/1999  . IPV 01/11/1998, 03/30/1998, 08/09/1998, 11/17/2001  . MMR 11/23/1998, 11/17/2001  . Meningococcal B, OMV 06/22/2015  . Meningococcal Conjugate 06/22/2015  . Pneumococcal Conjugate-13 02/08/1999, 05/25/1999, 11/17/2001  . Tdap 06/06/2009  . Varicella 11/25/1998, 06/06/2009    PMH, PSH, SH reviewed PHYSICAL EXAM:  Wt Readings from Last 3 Encounters:  05/20/18 123 lb (55.8 kg)  07/11/17 122 lb (55.3 kg) (38 %, Z= -0.29)*  02/24/17 127 lb 12.8 oz (58 kg) (51 %, Z= 0.03)*   * Growth percentiles are based on CDC (Girls, 2-20 Years) data.      GC/chlamydia from pap HIV, RPR HPV #3 today. TdaP also due pap      Please use condoms all the time to prevent STDs as well as pregnancy. You can continue to use Plan B if needed for unprotected sex, to prevent pregnancy, but this is emergency contraception, not to be used  regularly for contraception. We discussed pills, patches, Nuva Ring and progesterone-only birth control methods, including Depo Provera shots, implants (ie nexplanon), and IUD's.  The last 2 would come from a GYN's office, so if you are interested in an IUD or implant, you would need to consult with a gynecologist.

## 2019-01-04 ENCOUNTER — Institutional Professional Consult (permissible substitution): Payer: BLUE CROSS/BLUE SHIELD | Admitting: Family Medicine

## 2019-01-05 DIAGNOSIS — Z01419 Encounter for gynecological examination (general) (routine) without abnormal findings: Secondary | ICD-10-CM | POA: Diagnosis not present

## 2019-01-05 DIAGNOSIS — Z118 Encounter for screening for other infectious and parasitic diseases: Secondary | ICD-10-CM | POA: Diagnosis not present

## 2019-01-05 DIAGNOSIS — Z113 Encounter for screening for infections with a predominantly sexual mode of transmission: Secondary | ICD-10-CM | POA: Diagnosis not present

## 2019-01-05 DIAGNOSIS — Z6825 Body mass index (BMI) 25.0-25.9, adult: Secondary | ICD-10-CM | POA: Diagnosis not present

## 2019-01-15 DIAGNOSIS — Z3041 Encounter for surveillance of contraceptive pills: Secondary | ICD-10-CM | POA: Diagnosis not present

## 2019-01-16 LAB — HM PAP SMEAR

## 2019-04-26 ENCOUNTER — Other Ambulatory Visit: Payer: Self-pay | Admitting: Family Medicine

## 2019-04-26 DIAGNOSIS — Z3009 Encounter for other general counseling and advice on contraception: Secondary | ICD-10-CM

## 2019-04-26 NOTE — Telephone Encounter (Signed)
Is this okay to refill? This came under Kelli Norton name and not yours. But this is your patient

## 2019-04-26 NOTE — Telephone Encounter (Signed)
She cancelled her last 2 appts (1 was physical), and doesn't have any scheduled (also cancelled her 2018 physicals, hasn't had one in a long time) She needs to schedule CPE (due for pap, 3rd HPV, TdaP).  Hasn't been seen in a year (acute visit). You can deny this.

## 2019-04-29 ENCOUNTER — Encounter: Payer: Self-pay | Admitting: *Deleted

## 2020-02-23 DIAGNOSIS — Z118 Encounter for screening for other infectious and parasitic diseases: Secondary | ICD-10-CM | POA: Diagnosis not present

## 2020-02-23 DIAGNOSIS — Z01419 Encounter for gynecological examination (general) (routine) without abnormal findings: Secondary | ICD-10-CM | POA: Diagnosis not present

## 2020-02-23 DIAGNOSIS — Z6830 Body mass index (BMI) 30.0-30.9, adult: Secondary | ICD-10-CM | POA: Diagnosis not present

## 2020-02-23 DIAGNOSIS — Z124 Encounter for screening for malignant neoplasm of cervix: Secondary | ICD-10-CM | POA: Diagnosis not present

## 2020-02-23 DIAGNOSIS — Z1151 Encounter for screening for human papillomavirus (HPV): Secondary | ICD-10-CM | POA: Diagnosis not present

## 2020-02-23 LAB — HM PAP SMEAR

## 2020-04-12 ENCOUNTER — Ambulatory Visit: Payer: Self-pay | Admitting: Medical

## 2020-04-14 ENCOUNTER — Encounter: Payer: Self-pay | Admitting: Family Medicine

## 2020-04-14 ENCOUNTER — Other Ambulatory Visit: Payer: Self-pay

## 2020-04-14 ENCOUNTER — Ambulatory Visit: Payer: BC Managed Care – PPO | Admitting: Family Medicine

## 2020-04-14 VITALS — BP 108/70 | HR 79 | Temp 98.2°F | Wt 156.8 lb

## 2020-04-14 DIAGNOSIS — R21 Rash and other nonspecific skin eruption: Secondary | ICD-10-CM | POA: Diagnosis not present

## 2020-04-14 NOTE — Progress Notes (Signed)
   Subjective:    Patient ID: Kelli Norton, female    DOB: [birth year], 24 y.o.   MRN: [id number]  HPI She is here for evaluation of a rash present on the right medial upper thigh area.  She did bring a picture of it with her.  She has been using topical medications including Clorox.   Review of Systems     Objective:   Physical Exam She has a area of hyper pigmentation approximately 1 and half centimeters in size with central clearing to the medial upper thigh area.  It was compared to the picture.  The picture does show erythema and blistering.       Assessment & Plan:  Rash I explained but the lesion seems to be healing quite nicely and no intervention is needed.  She was comfortable with that.

## 2020-07-11 ENCOUNTER — Ambulatory Visit: Payer: BC Managed Care – PPO

## 2020-07-18 ENCOUNTER — Other Ambulatory Visit: Payer: Self-pay

## 2020-07-18 ENCOUNTER — Ambulatory Visit: Payer: BC Managed Care – PPO

## 2020-07-18 ENCOUNTER — Ambulatory Visit (INDEPENDENT_AMBULATORY_CARE_PROVIDER_SITE_OTHER): Payer: BC Managed Care – PPO

## 2020-07-18 DIAGNOSIS — Z23 Encounter for immunization: Secondary | ICD-10-CM

## 2020-07-20 DIAGNOSIS — R05 Cough: Secondary | ICD-10-CM | POA: Diagnosis not present

## 2020-07-20 DIAGNOSIS — U071 COVID-19: Secondary | ICD-10-CM | POA: Diagnosis not present

## 2020-08-17 ENCOUNTER — Other Ambulatory Visit: Payer: Self-pay

## 2020-08-17 ENCOUNTER — Ambulatory Visit (INDEPENDENT_AMBULATORY_CARE_PROVIDER_SITE_OTHER): Payer: BC Managed Care – PPO

## 2020-08-17 DIAGNOSIS — Z23 Encounter for immunization: Secondary | ICD-10-CM | POA: Diagnosis not present

## 2020-08-21 DIAGNOSIS — A749 Chlamydial infection, unspecified: Secondary | ICD-10-CM

## 2020-08-21 HISTORY — DX: Chlamydial infection, unspecified: A74.9

## 2020-08-24 ENCOUNTER — Other Ambulatory Visit: Payer: Self-pay

## 2020-08-24 ENCOUNTER — Ambulatory Visit: Payer: BC Managed Care – PPO | Admitting: Family Medicine

## 2020-08-24 ENCOUNTER — Encounter: Payer: Self-pay | Admitting: Family Medicine

## 2020-08-24 VITALS — BP 118/80 | HR 84 | Ht 61.0 in | Wt 166.0 lb

## 2020-08-24 DIAGNOSIS — A749 Chlamydial infection, unspecified: Secondary | ICD-10-CM

## 2020-08-24 DIAGNOSIS — N898 Other specified noninflammatory disorders of vagina: Secondary | ICD-10-CM

## 2020-08-24 DIAGNOSIS — Z3009 Encounter for other general counseling and advice on contraception: Secondary | ICD-10-CM | POA: Diagnosis not present

## 2020-08-24 DIAGNOSIS — Z113 Encounter for screening for infections with a predominantly sexual mode of transmission: Secondary | ICD-10-CM

## 2020-08-24 LAB — POCT WET PREP (WET MOUNT)
Clue Cells Wet Prep Whiff POC: NEGATIVE
Trichomonas Wet Prep HPF POC: ABSENT

## 2020-08-24 NOTE — Progress Notes (Signed)
Chief Complaint  Patient presents with  . Vaginal Discharge    no odor, very itchy, Discharge is clear to white and started in the last few days.    Patient was last seen by me in 04/2018 for yeast infection.  Thinks she has had some mild ones since then, treated with 3 day Monistat, OTC.  Her current symptoms feel different.  She reports it is "itchier than normal"--unsure if yeast or something else. It also had a more sudden onset, couldn't tell when it was coming on, like usual.  .  She first noted vaginal discharge 6 days ago, cloudy-white in color, creamy not thick, no odor. Itching started 2 days ago, internal.  Denies any rash, lesions.  She had a tampon in a few days ago, new to using them--hurt to pull it out, maybe she caused irritation or tear.  +sexually active.  Monogamous relationship for almost a year. Used condoms with him initially, none x 2 months. She is under the care of Dr. Cherly Hensen for GYN care, who had been prescribing her OCP's.  She reports that she "took a break" from the pills, after she had a lot of ongoing bleeding related to a generic pill.  She hasn't restarted pills yet, and isn't using condoms. Her LMP was 10/18.    No lubricants, change in fabric softeners, detergents, pantiliners, soaps or other causes for itching.  PMH, PSH, SH reviewed  Outpatient Encounter Medications as of 08/24/2020  Medication Sig  . [DISCONTINUED] ibuprofen (ADVIL,MOTRIN) 600 MG tablet Take 1 tablet (600 mg total) by mouth every 8 (eight) hours as needed. (Patient not taking: Reported on 05/20/2018)  . [DISCONTINUED] norethindrone-ethinyl estradiol (JUNEL FE 1/20) 1-20 MG-MCG tablet Take 1 tablet by mouth daily.   No facility-administered encounter medications on file as of 08/24/2020.   No Known Allergies  ROS: no fever, chills, URI symptoms, urinary complaints, bleeding, bruising, rash.  Vaginal discharge and itching per HPI.   PHYSICAL EXAM:  BP 118/80   Pulse 84   Ht 5'  1" (1.549 m)   Wt 166 lb (75.3 kg)   LMP 08/07/2020 (Approximate)   BMI 31.37 kg/m   Pleasant, well-appearing female in no distress HEENT: conjunctiva and sclera are clear, wearing mask Neck: no lymphadenopathy or mass Heart: regular rate and rhythm Lungs: clear bilaterally Abdomen: soft, nontender, no mass GU: external genitalia is notable for erythema anteriorly at introitus.  There are no lesions, ulcerations, or other abnormality, just redness. There is a thin, white discharge, small amount.  Cervix appears normal, no lesions.  Wet prep: squams noted, no PMN's, no bacteria, no clue cells. No trich seen.  No hyphae or yeast present.   ASSESSMENT/PLAN:  Vaginal discharge - Plan: GC/Chlamydia Probe Amp, POCT Wet Prep [facility])  Screen for STD (sexually transmitted disease) - Plan: HIV Antibody (routine testing w rflx), GC/Chlamydia Probe Amp, RPR, Hepatitis C antibody  Vaginal itching - reassured no evidence of infection.  discharge appears normal/physiologic. +e/o irritation. Trial vagisil (benzocain)  Counseling for birth control, oral contraceptives - Encouraged her to restart OCP's, cont use of condoms until on 2nd pack. Discussed Plan B. At risk for pregnancy currently   Recently got 2nd COVID vaccine; will wait to get flu shot until it has been 2 weeks.  I spent 35 minutes dedicated to the care of this patient, including pre-visit review of records, face to face time including counseling re: contraception, STD's and pregnancy risk, performing and evaluation KOH/wet prep under microscope,  and documentation of visit.

## 2020-08-24 NOTE — Patient Instructions (Signed)
I saw no evidence of any vaginal infection (yeast, BV, Herpes, trichomonas or other). We are sending off a specimen for gonorrhea and chlamydia. We did blood tests for STD's--syphilis and HIV, and also did your hepatitis C screening test.  I recommend using Vagisil for irritation (the one with benzocain is good--it should numb the area). Blot/pat the area dry after urinating, to avoid further irritation from the toilet tissue.  Return if symptoms persist or worsen. We will send you your test results through MyChart.  PLEASE use condoms regularly--at least for the first month that you are back taking birth control pills.  After the first pack, you can stop if no concerns for infidelity or STD's.  Always use condoms if you're put on antibiotics or if you miss pills.  Remember Plan B is available to use after unprotected sex to prevent pregnancy (not for regular use for contraception, but in case you forget a condom, or it falls off, breaks, etc).

## 2020-08-25 LAB — HEPATITIS C ANTIBODY: Hep C Virus Ab: <0.1 s/co ratio (ref 0.0–0.9)

## 2020-08-25 LAB — HIV ANTIBODY (ROUTINE TESTING W REFLEX): HIV Screen 4th Generation wRfx: NONREACTIVE

## 2020-08-25 LAB — RPR: RPR Ser Ql: NONREACTIVE

## 2020-08-27 ENCOUNTER — Encounter: Payer: Self-pay | Admitting: Family Medicine

## 2020-08-27 LAB — GC/CHLAMYDIA PROBE AMP
Chlamydia trachomatis, NAA: POSITIVE — AB
Neisseria Gonorrhoeae by PCR: NEGATIVE

## 2020-08-27 MED ORDER — AZITHROMYCIN 500 MG PO TABS: 1000.0000 mg | ORAL_TABLET | Freq: Once | ORAL | 0 refills | Status: AC

## 2020-08-27 NOTE — Addendum Note (Signed)
Addended by: Joselyn Arrow on: 08/27/2020 06:49 PM   Modules accepted: Orders

## 2020-09-19 NOTE — Progress Notes (Signed)
Chief Complaint  Patient presents with  . Follow-up    test of cure for chlamydia. Also needs Juanetta Gosling for work. (I ordered) Email result to [email or web address] and [email or web address]  . Flu Vaccine    would like to wait.     Patient presents for test of cure, follow-up on chlamydia.   She also needs Quantiferon test for work. 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  She was seen last month with complaint of vaginal discharge. STD testing revealed chlamydia, rest of tests were negative. She was treated with 1 g of azithromycin. Partner was also treated.   She is a little itchy, has a slight creamy white discharge. Denies abdominal pain. She hasn't resumed sexual activity with partner yet.  She has been in a relationship with him for almost a year.  Sexually active only in the last few months.  Used condoms with him initially, none x 2 months.  He reports he possibly got it from his ex.  She is under the care of Dr. Cherly Hensen for GYN care, who had been prescribing her OCP's. She reports that she "took a break" from the pills, after she had a lot of ongoing bleeding related to a generic pill.  She hasn't restarted pills yet. She doesn't have an appt scheduled yet with her for WWE. She still has RF on her OCP's at pharmacy.   PMH, PSH, SH reviewed  No outpatient encounter medications on file as of 09/20/2020.   No facility-administered encounter medications on file as of 09/20/2020.   No Known Allergies  ROS: no fever, chills, URI symptoms, cough, shortness of breath, chest pain.   No pelvic pain, urinary complaints, nausea, vomiting, diarrhea. Slight itch and creamy discharge.   PHYSICAL EXAM:  BP 120/72   Pulse 64   Ht 5\' 1"  (1.549 m)   Wt 167 lb 12.8 oz (76.1 kg)   LMP 09/10/2020 (Exact Date)   BMI 31.71 kg/m   Wt Readings from Last 3 Encounters:  08/24/20 166 lb (75.3 kg)  04/14/20 156 lb 12.8 oz (71.1 kg)  05/20/18 123 lb (55.8 kg)    Pleasant, well-appearing female in good spirits HEENT: conjunctiva and sclera are clear, EOMI, wearing mask Heart: regular rate and rhythm Lungs: clear bilaterally Abdomen: soft, nontender, no mass External genitalia normal, no lesions.  No abnormal vaginal discharge noted. No cervical motion tenderness, uterine or adnexal tenderness Extremities: no edema Skin: normal turgor, no rash Psych: normal mood, affect, hygiene and grooming   ASSESSMENT/PLAN:  History of chlamydia infection - TOC today. Cont condom use. Plan B use reviewed if forgotten/broken. Consider restart OCPs, but to cont condoms. - Plan: GC/Chlamydia Probe Amp  Screening examination for pulmonary tuberculosis - Plan: QuantiFERON-TB Gold Plus  Discussed flu shots--doesn't want today.  Can get at work (works at [employer]/22/18), or to return for NV.   She hasn't been seen for CPE's, and has no record of TdaP (likely >10 years ago per pt). Discussed NV for TdaP (and can either get same day as flu shot, or separate by 2 weeks).  F/u with GYN for routine visits.

## 2020-09-20 ENCOUNTER — Other Ambulatory Visit: Payer: Self-pay

## 2020-09-20 ENCOUNTER — Encounter: Payer: Self-pay | Admitting: Family Medicine

## 2020-09-20 ENCOUNTER — Ambulatory Visit: Payer: BC Managed Care – PPO | Admitting: Family Medicine

## 2020-09-20 VITALS — BP 120/72 | HR 64 | Ht 61.0 in | Wt 167.8 lb

## 2020-09-20 DIAGNOSIS — Z111 Encounter for screening for respiratory tuberculosis: Secondary | ICD-10-CM

## 2020-09-20 DIAGNOSIS — Z8619 Personal history of other infectious and parasitic diseases: Secondary | ICD-10-CM | POA: Diagnosis not present

## 2020-09-20 NOTE — Patient Instructions (Addendum)
Please get your flu shot at work (and let us know the date).  Be sure to use condoms all the time. If you plan to restart your birth control pills, you need to use condoms for the entire first pack.  I encourage you to continue to use them long-term for STD prevention, but technically after the first pack your pills are effective in preventing pregnancy (pregnancy only, not STDs).  I recommend taking either a multivitamin daily or a D3 1000 IU daily.  You are past due for a tetanus shot (TdaP).   You can schedule a nurse visit at our office for this. You can either do this the same day as a flu shot (we give both), or wait 2 weeks after you get a flu shot at work to have the tetanus.

## 2020-09-21 LAB — GC/CHLAMYDIA PROBE AMP
Chlamydia trachomatis, NAA: NEGATIVE
Neisseria Gonorrhoeae by PCR: NEGATIVE

## 2020-09-22 ENCOUNTER — Encounter: Payer: Self-pay | Admitting: Family Medicine

## 2020-09-22 LAB — QUANTIFERON-TB GOLD PLUS
QuantiFERON Mitogen Value: >10 IU/mL
QuantiFERON Nil Value: 0.08 IU/mL
QuantiFERON TB1 Ag Value: 0.12 IU/mL
QuantiFERON TB2 Ag Value: 0.13 IU/mL
QuantiFERON-TB Gold Plus: NEGATIVE

## 2021-01-02 NOTE — Progress Notes (Signed)
Chief Complaint  Patient presents with  . STD CHECK    Patient is here for STD check, not having any symptoms.     Patient presents for STD check.  She wants to get into the habit of doing these more regularly. She denies any vaginal discharge or pelvic pain.  She has slight discomfort, "crampy", just started birth control pills a few days ago.  No headaches or nausea.  She was seen in 08/2020 with complaint of vaginal discharge. STD testing revealed chlamydia, rest of tests were negative (RPR, HIV, Hep C, and wet prep). She was treated with 1 g of azithromycin. Partner was also treated.  She had negative test of cure in 09/2020.    She is using condoms regularly (same partner), but it has "busted" one time, so wants to be tested. She took Plan B.  LMP 12/24/2020. She had been off OCP's for "quite a while", just restarted this past weekend.  She is on micronor per chart, and she confirms that she believes this is what she is currently taking. She denies any spotting/bleeding.  She is under care of Dr. Cherly Norton for GYN care, and she prescribes her OCP's. 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  No records received by [place] since then. She states she has been back, and needs to call to schedule appointment for this year. She reports having called their office checking on appointments, however when she looked in her phone, last appt she saw was 12/2018.  PMH, PSH, SH reviewed  Outpatient Encounter Medications as of 01/03/2021  Medication Sig Note  . ELDERBERRY PO Take 1 each by mouth daily. 01/03/2021: Gummy  . norethindrone (MICRONOR) 0.35 MG tablet Take 1 tablet by mouth daily.    No facility-administered encounter medications on file as of 01/03/2021.   No Known Allergies  ROS: no fever, chills, URI symptoms, headache, nausea, bowel changes, urinary complaints, vaginal discharge.  Slight lower abdominal cramping. No rashes, abnormal bleeding, or other issues.   PHYSICAL EXAM:  BP 120/70    Pulse 84   Ht 5\' 1"  (1.549 m)   Wt 170 lb 3.2 oz (77.2 kg)   LMP 12/24/2020 (Exact Date)   BMI 32.16 kg/m   Well-appearing, pleasant female in no distress HEENT: conjunctiva and sclera are clear, EOMI, wearing mask. Neck: no lymphadenopathy Heart: regular rate and rhythm Lungs: clear bilaterally Abdomen: very mild diffuse tenderness across lower abdomen Extremities: no edema Psych: normal mood, affect, hygiene and grooming.  Urine pregnancy negative   ASSESSMENT/PLAN:  Unprotected sex - Plan: GC/Chlamydia Probe Amp, RPR, HIV Antibody (routine testing w rflx), POCT urine pregnancy  Screen for STD (sexually transmitted disease) - Plan: GC/Chlamydia Probe Amp, RPR, HIV Antibody (routine testing w rflx)   HIV/RPR, GC and chlamydia today Get records from GYN (unclear if she has had f/u since LGSIL pap smear in 2020)   Continue regular use of condoms. Remember that your birth control pills aren't effective for contraception until after the first month/pack.  Any antibiotics can interfere with them (make them less effective).  It is recommended that you continue to use condoms along with the pills, to prevent STDs.  Review of the available information that I have shows that you had an abnormal pap smear in 12/2018.  Unsure if you have had follow up on this with Dr. 01/2019. Please call their office today to see if this is the case, and to schedule an appointment.

## 2021-01-03 ENCOUNTER — Ambulatory Visit: Payer: 59 | Admitting: Family Medicine

## 2021-01-03 ENCOUNTER — Other Ambulatory Visit: Payer: Self-pay

## 2021-01-03 ENCOUNTER — Encounter: Payer: Self-pay | Admitting: Family Medicine

## 2021-01-03 VITALS — BP 120/70 | HR 84 | Ht 61.0 in | Wt 170.2 lb

## 2021-01-03 DIAGNOSIS — Z7251 High risk heterosexual behavior: Secondary | ICD-10-CM | POA: Diagnosis not present

## 2021-01-03 DIAGNOSIS — Z113 Encounter for screening for infections with a predominantly sexual mode of transmission: Secondary | ICD-10-CM | POA: Diagnosis not present

## 2021-01-03 DIAGNOSIS — A749 Chlamydial infection, unspecified: Secondary | ICD-10-CM | POA: Diagnosis not present

## 2021-01-03 LAB — POCT URINE PREGNANCY: Preg Test, Ur: NEGATIVE

## 2021-01-03 NOTE — Patient Instructions (Signed)
Continue regular use of condoms. Remember that your birth control pills aren't effective for contraception until after the first month/pack.  Any antibiotics can interfere with them (make them less effective).  It is recommended that you continue to use condoms along with the pills, to prevent STDs.  Review of the available information that I have shows that you had an abnormal pap smear in 12/2018.  Unsure if you have had follow up on this with Dr. Cherly Hensen. Please call their office today to see if this is the case, and to schedule an appointment.

## 2021-01-04 LAB — HIV ANTIBODY (ROUTINE TESTING W REFLEX): HIV Screen 4th Generation wRfx: NONREACTIVE

## 2021-01-04 LAB — RPR: RPR Ser Ql: NONREACTIVE

## 2021-01-04 LAB — GC/CHLAMYDIA PROBE AMP
Chlamydia trachomatis, NAA: POSITIVE — AB
Neisseria Gonorrhoeae by PCR: NEGATIVE

## 2021-01-05 ENCOUNTER — Encounter: Payer: Self-pay | Admitting: Family Medicine

## 2021-01-05 MED ORDER — AZITHROMYCIN 500 MG PO TABS: 1000.0000 mg | ORAL_TABLET | Freq: Once | ORAL | 0 refills | Status: AC

## 2021-01-05 NOTE — Addendum Note (Signed)
Addended by: Joselyn Arrow on: 01/05/2021 08:16 AM   Modules accepted: Orders

## 2021-01-16 ENCOUNTER — Encounter: Payer: Self-pay | Admitting: Family Medicine

## 2021-01-19 ENCOUNTER — Encounter: Payer: Self-pay | Admitting: Internal Medicine

## 2021-02-19 ENCOUNTER — Encounter: Payer: Self-pay | Admitting: Family Medicine

## 2021-02-19 ENCOUNTER — Ambulatory Visit: Payer: 59 | Admitting: Family Medicine

## 2021-02-19 ENCOUNTER — Other Ambulatory Visit: Payer: Self-pay

## 2021-02-19 VITALS — BP 110/60 | HR 72 | Ht 61.0 in | Wt 173.4 lb

## 2021-02-19 DIAGNOSIS — Z8619 Personal history of other infectious and parasitic diseases: Secondary | ICD-10-CM

## 2021-02-19 DIAGNOSIS — N898 Other specified noninflammatory disorders of vagina: Secondary | ICD-10-CM | POA: Diagnosis not present

## 2021-02-19 LAB — POCT WET PREP (WET MOUNT)
Clue Cells Wet Prep Whiff POC: NEGATIVE
Trichomonas Wet Prep HPF POC: ABSENT

## 2021-02-19 NOTE — Progress Notes (Signed)
Chief Complaint  Patient presents with  . Vaginal Discharge    Few days ago started with a milky white discharge. Not clumpy, not itchy and no odor. Did a one day Monistat treatment Sat and feels better but does want to make sure everything is ok. Thinks there may be a scratch of some sort in vaginal area and wants to check.     3-4 days ago she noticed milky white vaginal discharge.  She used Monistat to "catch it", thinking it could be a yeast infection that she didn't want to get worse. Denies any vaginal itching or thick discharge.  She saw a "scratch" at the vaginal entry when putting in the Monistat. Unsure if it could be related to a fingernail vs related to intercourse. Using vaseline, helps some, improving, still a little sensitive.  She has h/o chlamydia in 3/16.  She and partner were both treated. Also + chlamydia in 08/2020, with a negative TOC in 09/2020. On 2nd month of OCP's (progresterone-only), also using condoms No unprotected sex   PMH, PSH, SH reviewed  Outpatient Encounter Medications as of 02/19/2021  Medication Sig Note  . norethindrone (MICRONOR) 0.35 MG tablet Take 1 tablet by mouth daily.   . [DISCONTINUED] ELDERBERRY PO Take 1 each by mouth daily. 01/03/2021: Gummy   No facility-administered encounter medications on file as of 02/19/2021.   No Known Allergies  ROS: no fever, chills, pelvic pain, urinary complaints.  Vaginal discharge per HPI. No URI symptoms or other complaints.  PHYSICAL EXAM:  BP 110/60   Pulse 72   Ht 5\' 1"  (1.549 m)   Wt 173 lb 6.4 oz (78.7 kg)   LMP 02/05/2021 (Exact Date)   BMI 32.76 kg/m   Well-appearing, pleasant female, in good spirits. Heart; regular rate and rhythm, no murmur Lungs: clear bilaterally Back: no CVA tenderness Abdomen: soft, nontender, no mass Mildly tender shotty inguinal lymph node on the left. GU: normal external exam, no lesion/fissure noted (area of minimal irritation/healed) Speculum exam--no  significant discharge in vaginal vault.  At cervical os--small amount of creamy/mucoid discharge at cervical os. Slightly friable. No cervical motion tenderness, no adnexal tenderness or mass  Wet prep--no clue cells no trich, no PMN's, rare bacteria. ?rare hyphae on wet prep, though none noted on KOH.  ASSESSMENT/PLAN:  Vaginal discharge - no e/o BV, yeast (no significant, already treated) or trich on exam. Sent for gc/chlamydia, +h/o chlamydia recently. No e/o PID - Plan: GC/Chlamydia Probe Amp, POCT Wet Prep [facility])  History of chlamydia - twice in the last 6 months. swab sent for Chlamydia/GC. never had TOC after last treatment. Encouraged regular condom use    [facility] [phone]

## 2021-02-19 NOTE — Patient Instructions (Signed)
Continue to use condoms. Results will be back in 2-3 days.

## 2021-02-21 LAB — GC/CHLAMYDIA PROBE AMP
Chlamydia trachomatis, NAA: NEGATIVE
Neisseria Gonorrhoeae by PCR: NEGATIVE

## 2021-02-22 ENCOUNTER — Ambulatory Visit: Payer: 59 | Admitting: Family Medicine

## 2021-03-26 ENCOUNTER — Ambulatory Visit: Payer: 59 | Admitting: Family Medicine

## 2021-03-26 ENCOUNTER — Encounter: Payer: Self-pay | Admitting: Family Medicine

## 2021-03-26 ENCOUNTER — Other Ambulatory Visit: Payer: Self-pay

## 2021-03-26 VITALS — BP 120/80 | HR 96 | Temp 98.7°F | Ht 61.0 in | Wt 175.8 lb

## 2021-03-26 DIAGNOSIS — R300 Dysuria: Secondary | ICD-10-CM | POA: Diagnosis not present

## 2021-03-26 DIAGNOSIS — Z7251 High risk heterosexual behavior: Secondary | ICD-10-CM | POA: Diagnosis not present

## 2021-03-26 DIAGNOSIS — N898 Other specified noninflammatory disorders of vagina: Secondary | ICD-10-CM

## 2021-03-26 LAB — POCT URINALYSIS DIP (PROADVANTAGE DEVICE)
Bilirubin, UA: NEGATIVE
Blood, UA: NEGATIVE
Glucose, UA: NEGATIVE mg/dL
Ketones, POC UA: NEGATIVE mg/dL
Leukocytes, UA: NEGATIVE
Nitrite, UA: NEGATIVE
Protein Ur, POC: NEGATIVE mg/dL
Specific Gravity, Urine: 1.015
Urobilinogen, Ur: NEGATIVE
pH, UA: 6 (ref 5.0–8.0)

## 2021-03-26 LAB — POCT WET PREP (WET MOUNT)
Clue Cells Wet Prep Whiff POC: NEGATIVE
Trichomonas Wet Prep HPF POC: ABSENT

## 2021-03-26 NOTE — Progress Notes (Signed)
Chief Complaint  Patient presents with  . Exposure to STD    Seen at [facility] 02/21/21-positive for BV, UTI and trichomoniasis. Took all rx'd medications. Would like test of cure for all and also had another exposure to trich 03/11/21.    Seen in UC 5/4, treated for BV with metrogel.  She was notified by phone a few days later that results showed trichomonas and UTI, treated with oral flagyl and nitrofurantoin. She vomited after taking flagyl the first time, had to get it refilled, and split it up some and she was able to tolerate it.  She had swollen lymph nodes at the groin, pain with urination, and vaginal discharge at time of presentation in UC.  She reports that all symptoms resolved with treatment.  She returns today to "ensure that everything is gone".  Currently she feels like there is a small amount of discharge.  Slight itch, slight odor noted today (not fishy). Itching is not internal, not terrible.  No rash, LN not swollen.  She did have an episode of unprotected sex on 5/22, when condom broke. She is on OCP's.  She reports that she got trichomonas and chlamydia from a six month relationship, which after that ended, she got back together with her boyfriend whom she had been with for 2 years. This is who she had been with on 5/22 when condom broke.     PMH, PSH, SH reviewed   Outpatient Encounter Medications as of 03/26/2021  Medication Sig  . norethindrone (MICRONOR) 0.35 MG tablet Take 1 tablet by mouth daily.  . metroNIDAZOLE (FLAGYL) 500 MG tablet Take 2,000 mg by mouth once. (Patient not taking: Reported on 03/26/2021)  . metroNIDAZOLE (METROGEL) 0.75 % vaginal gel Place vaginally. (Patient not taking: Reported on 03/26/2021)  . nitrofurantoin, macrocrystal-monohydrate, (MACROBID) 100 MG capsule PLEASE SEE ATTACHED FOR DETAILED DIRECTIONS (Patient not taking: Reported on 03/26/2021)  . [DISCONTINUED] valACYclovir (VALTREX) 500 MG tablet Take 500 mg by mouth 2 (two) times daily.   No  facility-administered encounter medications on file as of 03/26/2021.   No Known Allergies  ROS: No n/v/d. Vaginal symptoms per HPI.  No fever, chills.  No urinary symptoms. No URI symptoms or other concerns. See HPI   PHYSICAL EXAM:  BP 120/80   Pulse 96   Temp 98.7 F (37.1 C) (Tympanic)   Ht 5\' 1"  (1.549 m)   Wt 175 lb 12.8 oz (79.7 kg)   LMP 03/10/2021 (Exact Date)   BMI 33.22 kg/m   Well-appearing, pleasant female, in good spirits, in no distress HEENT: conjunctiva and sclera are clear, EOMI. Wearing mask Heart: regular rate and rhythm Lungs: clear bilaterally Back: no CVA or spinal tenderness Abdomen: soft, nontender, no mass Extremities: no edema GU: normal external genitalia without lesions.  On speculum exam there is noted to have a small amount of white, slightly thick discharge in vaginal vault. No cervical discharge, no friability.  KOH/wet prep--unremarkable.  No PMN's, bacteria, clue cells, no trich, no hyphae Urine dip: normal   ASSESSMENT/PLAN:  Vaginal discharge - no e/o yeast, appears normal - Plan: GC/Chlamydia Probe Amp, POCT Wet Prep ([place] [place])  Dysuria - recent UTI, s/p course of ABX.  Symptoms resolved, urine is clear today - Plan: POCT Urinalysis DIP (Proadvantage Device)  Unprotected sex - due to broken condoms. Will check for GC/chlamydia. no e/o trich on wet prep - Plan: GC/Chlamydia Probe Amp  Counseled re: safe sex, s/sx of various infections. Cont OCP's--doesn't need Plan B if  using OCP's (used recently).   I spent 34 minutes dedicated to the care of this patient, including pre-visit review of records, face to face time, post-visit ordering of testing and documentation.

## 2021-03-27 LAB — GC/CHLAMYDIA PROBE AMP
Chlamydia trachomatis, NAA: NEGATIVE
Neisseria Gonorrhoeae by PCR: NEGATIVE

## 2021-04-19 ENCOUNTER — Ambulatory Visit: Payer: 59 | Admitting: Family Medicine

## 2021-10-29 DIAGNOSIS — Z113 Encounter for screening for infections with a predominantly sexual mode of transmission: Secondary | ICD-10-CM | POA: Diagnosis not present

## 2021-12-21 DIAGNOSIS — R1031 Right lower quadrant pain: Secondary | ICD-10-CM | POA: Diagnosis not present

## 2021-12-21 DIAGNOSIS — R519 Headache, unspecified: Secondary | ICD-10-CM | POA: Diagnosis not present

## 2021-12-21 DIAGNOSIS — R1032 Left lower quadrant pain: Secondary | ICD-10-CM | POA: Diagnosis not present

## 2021-12-21 DIAGNOSIS — R110 Nausea: Secondary | ICD-10-CM | POA: Diagnosis not present

## 2022-01-08 DIAGNOSIS — Z113 Encounter for screening for infections with a predominantly sexual mode of transmission: Secondary | ICD-10-CM | POA: Diagnosis not present

## 2022-01-08 DIAGNOSIS — N898 Other specified noninflammatory disorders of vagina: Secondary | ICD-10-CM | POA: Diagnosis not present

## 2022-02-27 DIAGNOSIS — Z01419 Encounter for gynecological examination (general) (routine) without abnormal findings: Secondary | ICD-10-CM | POA: Diagnosis not present

## 2022-02-27 DIAGNOSIS — R87610 Atypical squamous cells of undetermined significance on cytologic smear of cervix (ASC-US): Secondary | ICD-10-CM | POA: Diagnosis not present

## 2022-02-27 DIAGNOSIS — A6004 Herpesviral vulvovaginitis: Secondary | ICD-10-CM | POA: Diagnosis not present

## 2022-02-27 DIAGNOSIS — Z1159 Encounter for screening for other viral diseases: Secondary | ICD-10-CM | POA: Diagnosis not present

## 2022-02-27 DIAGNOSIS — Z113 Encounter for screening for infections with a predominantly sexual mode of transmission: Secondary | ICD-10-CM | POA: Diagnosis not present

## 2022-02-27 LAB — HM PAP SMEAR: HM Pap smear: NEGATIVE

## 2022-02-27 LAB — OB RESULTS CONSOLE GC/CHLAMYDIA: Chlamydia: NEGATIVE

## 2022-04-11 ENCOUNTER — Ambulatory Visit: Payer: No Typology Code available for payment source | Admitting: Family

## 2022-04-17 ENCOUNTER — Telehealth: Payer: Self-pay | Admitting: *Deleted

## 2022-04-17 NOTE — Telephone Encounter (Signed)
Form in your folder needs immunizations filled out. She has not had Tdap since 2010. Would you like her to schedule CPE?

## 2022-04-17 NOTE — Telephone Encounter (Signed)
Called [facility] and got her scheduled for 04/29/22-needs form by 05/21/22.

## 2022-04-17 NOTE — Telephone Encounter (Signed)
She hasn't been seen in a year, hasn't seen [place] for physical. Yes, I'd prefer physical if we can get her a spot quickly. Not sure when she needs the form by.

## 2022-04-27 NOTE — Progress Notes (Unsigned)
  No chief complaint on file.  Kelli Norton is a 26 y.o. female who presents for a complete physical.  S he has the following concerns:  Last STD check was negative at [facility] in 12/2021.   Immunization History  Administered Date(s) Administered   DTaP 01/11/1998, 03/30/1998, 06/01/1998, 02/08/1999, 11/17/2001   DTaP / Hep B / IPV 02/08/1999, 05/25/1999   HPV 9-valent 06/22/2015, 05/20/2018   Hepatitis A 06/06/2009, 02/27/2011   Hepatitis B Jul 08, 1998, 12/09/1997, 08/09/1998   HiB (PRP-OMP) 01/11/1998, 03/30/1998, 02/08/1999   IPV 01/11/1998, 03/30/1998, 08/09/1998, 11/17/2001   MMR 11/23/1998, 11/17/2001   Meningococcal B, OMV 06/22/2015   Meningococcal Conjugate 06/22/2015   Moderna Sars-Covid-2 Vaccination 07/18/2020, 08/17/2020   Pneumococcal Conjugate-13 02/08/1999, 05/25/1999, 11/17/2001   Tdap 06/06/2009   Varicella 11/25/1998, 06/06/2009   Last Pap smear: 02/2020 ASCUS at [facility] Last mammogram: never Last colonoscopy: never Last DEXA: never Dentist: Ophtho: Exercise:  Lipids: Lab Results  Component Value Date   CHOL 124 (L) 06/22/2015   HDL 47 06/22/2015   LDLCALC 69 06/22/2015   TRIG 38 (L) 06/22/2015   CHOLHDL 2.6 06/22/2015    PMH, PSH, SH and FH were reviewed and updated   ROS:  The patient denies anorexia, fever, weight changes, headaches,  vision changes, decreased hearing, ear pain, sore throat, breast concerns, chest pain, palpitations, dizziness, syncope, dyspnea on exertion, cough, swelling, nausea, vomiting, diarrhea, constipation, abdominal pain, melena, hematochezia, indigestion/heartburn, hematuria, incontinence, dysuria, irregular menstrual cycles, vaginal discharge, odor or itch, genital lesions, joint pains, numbness, tingling, weakness, tremor, suspicious skin lesions, depression, anxiety, abnormal bleeding/bruising, or enlarged lymph nodes. ***UPDATE ALL   PHYSICAL EXAM:  There were no vitals taken for this visit.  Wt Readings  from Last 3 Encounters:  03/26/21 175 lb 12.8 oz (79.7 kg)  02/19/21 173 lb 6.4 oz (78.7 kg)  01/03/21 170 lb 3.2 oz (77.2 kg)    ASSESSMENT/PLAN:   Does she still see [facility]?  Last pap was ASCUS in 02/2020 that we have.  Should have had follow-ups. Also, looks like she is also scheduled with another primary care doc (FNP) later this month??? Are we doing breast/pelvic/pap, or seeing GYN??  TdaP Enter any flu or COVID boosters  Discussed monthly self breast exams and yearly mammograms after the age of 71; at least 30 minutes of aerobic activity at least 5 days/week, weight-bearing exercise at least 2x/week; proper sunscreen use reviewed; healthy diet, including goals of calcium and vitamin D intake and alcohol recommendations (less than or equal to 1 drink/day) reviewed; regular seatbelt use; changing batteries in smoke detectors.  Immunization recommendations discussed--yearly flu shots are recommended.  Updated bivalent COVID booster recommended when available in the Fall. TdaP Colonoscopy recommendations reviewed, age 46.

## 2022-04-29 ENCOUNTER — Encounter: Payer: Self-pay | Admitting: Family Medicine

## 2022-04-29 ENCOUNTER — Ambulatory Visit: Payer: BC Managed Care – PPO | Admitting: Family Medicine

## 2022-04-29 VITALS — BP 110/70 | HR 88 | Ht 61.0 in | Wt 177.0 lb

## 2022-04-29 DIAGNOSIS — Z23 Encounter for immunization: Secondary | ICD-10-CM | POA: Diagnosis not present

## 2022-04-29 DIAGNOSIS — Z6833 Body mass index (BMI) 33.0-33.9, adult: Secondary | ICD-10-CM

## 2022-04-29 DIAGNOSIS — Z0000 Encounter for general adult medical examination without abnormal findings: Secondary | ICD-10-CM | POA: Diagnosis not present

## 2022-04-29 DIAGNOSIS — A6000 Herpesviral infection of urogenital system, unspecified: Secondary | ICD-10-CM | POA: Diagnosis not present

## 2022-04-29 LAB — POCT HEMOGLOBIN: Hemoglobin: 12.5 g/dL (ref 11–14.6)

## 2022-04-29 NOTE — Patient Instructions (Signed)
Please consider getting flu shots yearly in the Fall (September). I encourage you to get the updated bivalent COVID booster when available in the Fall.

## 2022-04-30 ENCOUNTER — Encounter: Payer: Self-pay | Admitting: *Deleted

## 2022-05-01 ENCOUNTER — Encounter: Payer: Self-pay | Admitting: Family Medicine

## 2022-05-01 DIAGNOSIS — R300 Dysuria: Secondary | ICD-10-CM | POA: Diagnosis not present

## 2022-05-01 DIAGNOSIS — N3001 Acute cystitis with hematuria: Secondary | ICD-10-CM | POA: Diagnosis not present

## 2022-05-15 ENCOUNTER — Ambulatory Visit: Payer: No Typology Code available for payment source | Admitting: Family

## 2022-06-26 ENCOUNTER — Encounter: Payer: Self-pay | Admitting: Internal Medicine

## 2022-07-30 ENCOUNTER — Encounter: Payer: Self-pay | Admitting: Internal Medicine

## 2022-10-23 ENCOUNTER — Ambulatory Visit: Payer: BC Managed Care – PPO | Admitting: Family Medicine

## 2023-03-03 DIAGNOSIS — Z114 Encounter for screening for human immunodeficiency virus [HIV]: Secondary | ICD-10-CM | POA: Diagnosis not present

## 2023-03-03 DIAGNOSIS — Z01419 Encounter for gynecological examination (general) (routine) without abnormal findings: Secondary | ICD-10-CM | POA: Diagnosis not present

## 2023-03-03 DIAGNOSIS — Z1159 Encounter for screening for other viral diseases: Secondary | ICD-10-CM | POA: Diagnosis not present

## 2023-03-03 DIAGNOSIS — Z113 Encounter for screening for infections with a predominantly sexual mode of transmission: Secondary | ICD-10-CM | POA: Diagnosis not present

## 2023-04-04 DIAGNOSIS — F411 Generalized anxiety disorder: Secondary | ICD-10-CM | POA: Diagnosis not present

## 2023-04-11 DIAGNOSIS — F411 Generalized anxiety disorder: Secondary | ICD-10-CM | POA: Diagnosis not present

## 2023-04-18 DIAGNOSIS — F411 Generalized anxiety disorder: Secondary | ICD-10-CM | POA: Diagnosis not present

## 2023-05-02 DIAGNOSIS — F411 Generalized anxiety disorder: Secondary | ICD-10-CM | POA: Diagnosis not present

## 2023-05-08 DIAGNOSIS — F411 Generalized anxiety disorder: Secondary | ICD-10-CM | POA: Diagnosis not present

## 2023-05-30 DIAGNOSIS — F411 Generalized anxiety disorder: Secondary | ICD-10-CM | POA: Diagnosis not present

## 2023-06-25 DIAGNOSIS — F411 Generalized anxiety disorder: Secondary | ICD-10-CM | POA: Diagnosis not present

## 2023-07-07 DIAGNOSIS — F411 Generalized anxiety disorder: Secondary | ICD-10-CM | POA: Diagnosis not present

## 2023-07-23 DIAGNOSIS — F411 Generalized anxiety disorder: Secondary | ICD-10-CM | POA: Diagnosis not present

## 2023-08-19 DIAGNOSIS — F419 Anxiety disorder, unspecified: Secondary | ICD-10-CM | POA: Diagnosis not present

## 2023-08-20 DIAGNOSIS — F411 Generalized anxiety disorder: Secondary | ICD-10-CM | POA: Diagnosis not present

## 2023-09-01 DIAGNOSIS — F410 Panic disorder [episodic paroxysmal anxiety] without agoraphobia: Secondary | ICD-10-CM | POA: Diagnosis not present

## 2023-09-01 DIAGNOSIS — F4310 Post-traumatic stress disorder, unspecified: Secondary | ICD-10-CM | POA: Diagnosis not present

## 2023-09-01 DIAGNOSIS — F321 Major depressive disorder, single episode, moderate: Secondary | ICD-10-CM | POA: Diagnosis not present

## 2023-09-03 DIAGNOSIS — F411 Generalized anxiety disorder: Secondary | ICD-10-CM | POA: Diagnosis not present

## 2023-09-10 ENCOUNTER — Telehealth: Payer: Self-pay | Admitting: Family Medicine

## 2023-09-10 NOTE — Telephone Encounter (Signed)
Emailed to [email or web address] Called patient to confirm but did not answer.

## 2023-09-10 NOTE — Telephone Encounter (Signed)
Pt called & needs physical form completed by 09/16/23 for school & I advised her we didn't have any openings,  & suggested [facility] or [facility], she needs her immunization record emailed to her ASAP to have this completed

## 2023-09-12 DIAGNOSIS — F411 Generalized anxiety disorder: Secondary | ICD-10-CM | POA: Diagnosis not present

## 2023-09-15 NOTE — Telephone Encounter (Signed)
done

## 2023-09-25 DIAGNOSIS — F411 Generalized anxiety disorder: Secondary | ICD-10-CM | POA: Diagnosis not present

## 2023-10-07 DIAGNOSIS — F410 Panic disorder [episodic paroxysmal anxiety] without agoraphobia: Secondary | ICD-10-CM | POA: Diagnosis not present

## 2023-10-07 DIAGNOSIS — F325 Major depressive disorder, single episode, in full remission: Secondary | ICD-10-CM | POA: Diagnosis not present

## 2023-10-07 DIAGNOSIS — F4310 Post-traumatic stress disorder, unspecified: Secondary | ICD-10-CM | POA: Diagnosis not present

## 2023-10-08 DIAGNOSIS — F411 Generalized anxiety disorder: Secondary | ICD-10-CM | POA: Diagnosis not present

## 2023-11-17 ENCOUNTER — Ambulatory Visit (HOSPITAL_COMMUNITY)
Admission: EM | Admit: 2023-11-17 | Discharge: 2023-11-17 | Disposition: A | Discharge status: Home / Self Care | Payer: BC Managed Care – PPO | Attending: Psychiatry | Admitting: Psychiatry

## 2023-11-17 DIAGNOSIS — F22 Delusional disorders: Secondary | ICD-10-CM | POA: Insufficient documentation

## 2023-11-17 DIAGNOSIS — F410 Panic disorder [episodic paroxysmal anxiety] without agoraphobia: Secondary | ICD-10-CM | POA: Diagnosis present

## 2023-11-17 DIAGNOSIS — F419 Anxiety disorder, unspecified: Secondary | ICD-10-CM

## 2023-11-17 DIAGNOSIS — F29 Unspecified psychosis not due to a substance or known physiological condition: Secondary | ICD-10-CM | POA: Diagnosis not present

## 2023-11-17 DIAGNOSIS — F1290 Cannabis use, unspecified, uncomplicated: Secondary | ICD-10-CM | POA: Diagnosis not present

## 2023-11-17 DIAGNOSIS — Z3202 Encounter for pregnancy test, result negative: Secondary | ICD-10-CM | POA: Insufficient documentation

## 2023-11-17 LAB — URINALYSIS, COMPLETE (UACMP) WITH MICROSCOPIC
Bacteria, UA: NONE SEEN
Bilirubin Urine: NEGATIVE
Glucose, UA: NEGATIVE mg/dL
Hgb urine dipstick: NEGATIVE
Ketones, ur: NEGATIVE mg/dL
Leukocytes,Ua: NEGATIVE
Nitrite: NEGATIVE
Protein, ur: NEGATIVE mg/dL
Specific Gravity, Urine: 1.001 — ABNORMAL LOW (ref 1.005–1.030)
pH: 7 (ref 5.0–8.0)

## 2023-11-17 LAB — COMPREHENSIVE METABOLIC PANEL
ALT: 24 U/L (ref 0–44)
AST: 28 U/L (ref 15–41)
Albumin: 4.6 g/dL (ref 3.5–5.0)
Alkaline Phosphatase: 79 U/L (ref 38–126)
Anion gap: 17 — ABNORMAL HIGH (ref 5–15)
BUN: 10 mg/dL (ref 6–20)
CO2: 20 mmol/L — ABNORMAL LOW (ref 22–32)
Calcium: 9.9 mg/dL (ref 8.9–10.3)
Chloride: 98 mmol/L (ref 98–111)
Creatinine, Ser: 0.71 mg/dL (ref 0.44–1.00)
GFR, Estimated: >60 mL/min (ref >60)
Glucose, Bld: 96 mg/dL (ref 70–99)
Potassium: 3.3 mmol/L — ABNORMAL LOW (ref 3.5–5.1)
Sodium: 135 mmol/L (ref 135–145)
Total Bilirubin: 0.6 mg/dL (ref 0.0–1.2)
Total Protein: 8.3 g/dL — ABNORMAL HIGH (ref 6.5–8.1)

## 2023-11-17 LAB — CBC WITH DIFFERENTIAL/PLATELET
Abs Immature Granulocytes: 0.06 10*3/uL (ref 0.00–0.07)
Basophils Absolute: 0 10*3/uL (ref 0.0–0.1)
Basophils Relative: 0 %
Eosinophils Absolute: 0 10*3/uL (ref 0.0–0.5)
Eosinophils Relative: 0 %
HCT: 40.3 % (ref 36.0–46.0)
Hemoglobin: 13.7 g/dL (ref 12.0–15.0)
Immature Granulocytes: 1 %
Lymphocytes Relative: 15 %
Lymphs Abs: 1.9 10*3/uL (ref 0.7–4.0)
MCH: 30.6 pg (ref 26.0–34.0)
MCHC: 34 g/dL (ref 30.0–36.0)
MCV: 90.2 fL (ref 80.0–100.0)
Monocytes Absolute: 0.9 10*3/uL (ref 0.1–1.0)
Monocytes Relative: 7 %
Neutro Abs: 9.9 10*3/uL — ABNORMAL HIGH (ref 1.7–7.7)
Neutrophils Relative %: 77 %
Platelets: 467 10*3/uL — ABNORMAL HIGH (ref 150–400)
RBC: 4.47 MIL/uL (ref 3.87–5.11)
RDW: 13.7 % (ref 11.5–15.5)
WBC: 12.8 10*3/uL — ABNORMAL HIGH (ref 4.0–10.5)
nRBC: 0 % (ref 0.0–0.2)

## 2023-11-17 LAB — LIPID PANEL
Cholesterol: 253 mg/dL — ABNORMAL HIGH (ref 0–200)
HDL: 62 mg/dL (ref >40)
LDL Cholesterol: 181 mg/dL — ABNORMAL HIGH (ref 0–99)
Total CHOL/HDL Ratio: 4.1 {ratio}
Triglycerides: 50 mg/dL (ref <150)
VLDL: 10 mg/dL (ref 0–40)

## 2023-11-17 LAB — POCT URINE DRUG SCREEN - MANUAL ENTRY (I-SCREEN)
POC Amphetamine UR: NOT DETECTED
POC Buprenorphine (BUP): NOT DETECTED
POC Cocaine UR: NOT DETECTED
POC Marijuana UR: POSITIVE — AB
POC Methadone UR: NOT DETECTED
POC Methamphetamine UR: NOT DETECTED
POC Morphine: NOT DETECTED
POC Oxazepam (BZO): NOT DETECTED
POC Oxycodone UR: NOT DETECTED
POC Secobarbital (BAR): NOT DETECTED

## 2023-11-17 LAB — HEMOGLOBIN A1C
Hgb A1c MFr Bld: 4.9 % (ref 4.8–5.6)
Mean Plasma Glucose: 93.93 mg/dL

## 2023-11-17 LAB — ETHANOL: Alcohol, Ethyl (B): <10 mg/dL (ref <10)

## 2023-11-17 LAB — TSH: TSH: 1.709 u[IU]/mL (ref 0.350–4.500)

## 2023-11-17 NOTE — ED Provider Notes (Signed)
Behavioral Health Urgent Care Medical Screening Exam  Patient Name: Kelli Norton MRN: [id number] Date of Evaluation: 11/17/23 Chief Complaint:  Anxiety with panic attack Diagnosis:  Final diagnoses:  Paranoia (HCC)  Psychosis, unspecified psychosis type (HCC)  Anxiety    History of Present illness: Kelli Norton is a 28 y.o. female patient presented to [facility] as a walk in accompanied by her mother and father with complaints of "anxiety with panic attack".  Kelli Norton, 28 y.o., female patient seen face to face by this provider, chart reviewed, and consulted with Dr. Marval Regal on 11/17/23.  Patient reports she has been diagnosed with severe anxiety with panic attack.  She denies any inpatient psychiatric admissions.  She denies any suicide attempts.  She has recently been prescribed sertraline may be 1 month ago by Dr. Milagros Evener.  She stopped taking medication 1-2 weeks ago.  She also has a therapist that she sees roughly once per month. She endorses occasional alcohol use.  She endorses marijuana use daily for the past 1-2 weeks.  Patient's parents present during assessment at patient's request.  During evaluation Kelli Norton is observed sitting in assessment room in no acute distress.  She is casually and appropriately dressed.  She is alert/oriented x 4, cooperative, and attentive.  Her speech is clear, coherent, at a normal rate and tone.  She appears to have thought blocking and is slow to answer at times.  She is guarded and does not answer some assessment of the questions and states, "I am not comfortable disclosing that".  She denies depression.  She denies any concerns with appetite or sleep.  She does endorse anxiety and states her anxiety has become severe and she has had panic attacks at times.  She identifies stress as her biggest Administrator, sports.  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  She is calm  throughout the assessment.  She makes good eye contact.  She does appear paranoid.  She feels like people are watching her at times.  She does not elaborate any further on this subject.  She denies SI/HI/AVH.  She does not appear to be responding to internal/external stimuli.   Patient gave her parents permission to talk on her behalf.  They state she is not being forthcoming when answering assessment questions. They believe patient is not sleeping and has been concerned that she is pregnant. They have contacted her partner whom she lives with and he does not believe she has slept since Thursday.  They also state that she had a party on her [birth year] and she consumed alcohol and smoked marijuana.  Since that time she has heavily been been smoking THC.  They also believe that she is being bullied in her graduate program. Her boyfriend has stated that she is up throughout the night on the computer looking up conspiracy theories.  Patient recently had  a class project on the use of artificial intelligence and since that time she has become more paranoid.  Patient at does admit that she drink alcohol and has been smoking marijuana.  She did have concerns that she was pregnant and states that she has taken a couple of pregnancy test.  She denies that she is paranoid or delusional.  Recommended inpatient psychiatric admission with patient and her parents.  Patient and her parents are all adamant that they do not want patient to be admitted.  They also refused continuous assessment  unit admission for overnight observation.  Both parents are available to be with patient 24/7 to ensure her safety. Safety planning/intervention completed with family.  There are no firearms/weapons in the home.  They have agreed to secure any items that would be of safety concern out of patient's access.  Parents assured this Clinical research associate that they will be with patient 24/7.  In addition patient's mother called patient's psychiatric provider  Dr. Milagros Evener and Dr. Evelene Croon is able to see patient today at 1:45 PM.  Patient and her parents both are requesting that routine blood work, UDS, U/A, and urine pregnancy test be completed at this time.  UDS positive for marijuana, urine pregnancy is negative.  Patient continues to deny SI/HI/AVH throughout assessment.  Patient does not appear to be responding to internal/external stimuli.  Patient does not meet the criteria for involuntary commitment. Discussed with patient and her parents if patient continues to request to be discharged and declines inpatient admission  it would be AGAINST MEDICAL ADVICE as she has been recommended for inpatient psychiatric admission.  They verbalized understanding and continued to request to be discharged.  At this time Kelli Norton is educated and verbalizes understanding of mental health resources and other crisis services in the community. She is instructed to call 911 and present to the nearest emergency room should she experience any suicidal/homicidal ideation, auditory/visual/hallucinations, or detrimental worsening of her mental health condition.    Flowsheet Row ED from 11/17/2023 in [facility]  C-SSRS RISK CATEGORY No Risk       Psychiatric Specialty Exam  Presentation  General Appearance:Casual  Eye Contact:Fair  Speech:Clear and Coherent  Speech Volume:Normal  Handedness:Right   Mood and Affect  Mood: Anxious  Affect: Congruent   Thought Process  Thought Processes: Coherent  Descriptions of Associations:Intact  Orientation:Full (Time, Place and Person)  Thought Content:Paranoid Ideation  Diagnosis of Schizophrenia or Schizoaffective disorder in past: No  Duration of Psychotic Symptoms: Less than six months  Hallucinations:None  Ideas of Reference:None  Suicidal Thoughts:No  Homicidal Thoughts:No   Sensorium  Memory: Immediate Fair; Recent Fair; Remote  Fair  Judgment: Fair  Insight: Fair   Art therapist  Concentration: Fair  Attention Span: Fair  Recall: [employer] of Knowledge: Fair  Language: Good   Psychomotor Activity  Psychomotor Activity: Normal   Assets  Assets: Housing; Intimacy; Leisure Time; Physical Health; Resilience; Vocational/Educational; Transportation; Financial Resources/Insurance; Desire for Improvement; Communication Skills   Sleep  Sleep: Poor  Number of hours:  8 (pt reports no concerns with sleep - parents report none in 3 days)   Physical Exam: Physical Exam Constitutional:      Appearance: Normal appearance.  Eyes:     General:        Right eye: No discharge.        Left eye: No discharge.  Cardiovascular:     Rate and Rhythm: Normal rate.  Pulmonary:     Effort: Pulmonary effort is normal. No respiratory distress.  Musculoskeletal:        General: Normal range of motion.     Cervical back: Normal range of motion.  Skin:    Coloration: Skin is not jaundiced or pale.  Neurological:     Mental Status: She is alert and oriented to person, place, and time.  Psychiatric:        Attention and Perception: She perceives auditory and visual hallucinations.        Mood and Affect: Mood  is anxious.        Speech: Speech normal.        Behavior: Behavior is withdrawn (guarded).        Thought Content: Thought content is paranoid.        Cognition and Memory: Cognition normal.        Judgment: Judgment normal.    Review of Systems  Constitutional:  Negative for chills and fever.  HENT:  Negative for hearing loss.   Respiratory:  Negative for cough and shortness of breath.   Cardiovascular:  Negative for chest pain.  Gastrointestinal:  Negative for diarrhea.  Musculoskeletal: Negative.   Neurological:  Negative for dizziness, weakness and headaches.  Psychiatric/Behavioral:  The patient is nervous/anxious.    Blood pressure 125/89, pulse 79, resp. rate 20, SpO2  100%. There is no height or weight on file to calculate BMI.  Musculoskeletal: Strength & Muscle Tone: within normal limits Gait & Station: normal Patient leans: N/A   [facility] MSE Discharge Disposition for Follow up and Recommendations: Based on my evaluation the patient does not appear to have an emergency medical condition and can be discharged with resources and follow up care in outpatient services for Medication Management, Partial Hospitalization Program, and Individual Therapy  Discharge patient  Provided outpatient psychiatric resources for PHP/IOP program  Patient will follow-up with Dr. Milagros Evener today at 1:45 PM  Instructed patient to follow-up with her therapist to increase therapy visits.   Ardis Hughs, NP 11/17/2023, 2:16 PM

## 2023-11-17 NOTE — Progress Notes (Signed)
   11/17/23 1006  [facility] Triage Screening (Walk-ins at [facility] only)  How Did You Hear About [place]? Family/Friend  What Is the Reason for Your Visit/Call Today? Estrella is a 28 year old female presenting to [facility] accompanied by her parents. Pt reports that she has been diagnosed with severe anxiety and severe trauma. Pt is currently taking medication as prescribed. However, pt reports she stopped taking her medication a week ago. Pts mother reports that she has been talking to herself and showing signs of paranoia. Pt states, "I want to keep it". Pt denies substance use. Pt was unable to fully complete triage due to a panic attack.  How Long Has This Been Causing You Problems? 1 wk - 1 month  Possible abuse reported to: Other (Comment)  Are you currently experiencing any auditory, visual or other hallucinations? No  Have You Used Any Alcohol or Drugs in the Past 24 Hours? No  Do you have any current medical co-morbidities that require immediate attention? No  Clinician description of patient physical appearance/behavior: anxious, paranoid  What Do You Feel Would Help You the Most Today? Medication(s)  If access to [facility] was not available, would you have sought care in the Emergency Department? No  Determination of Need Urgent (48 hours)  Options For Referral Medication Management;Intensive Outpatient Therapy;Inpatient Hospitalization  Determination of Need filed? Yes

## 2023-11-17 NOTE — ED Notes (Signed)
Patient was provided a cup for a urine sample. Patient was able to urinate in the toilet but had put water in the cup.

## 2023-11-17 NOTE — Discharge Instructions (Signed)
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
## 2023-12-03 NOTE — Progress Notes (Unsigned)
No chief complaint on file.  Patient presents to discuss labs done elsewhere. Not seen by me since 04/2022.  She was brought to [facility] 11/17/23 for anxiety, panic attack, paranoia. Inpatient admission was recommended, declined by pt and parents (agreed to stay with her).  They contacted her psychiatrist, and were able to get a same day appointment with Dr. Evelene Croon. She had been seeing Dr. Evelene Croon for anxiety, and was started on sertraline (stopped med 1-2 wks prior to [facility] visit). She has a lot of stress related to graduate school--in last semester, to graduate in May.   The following labs were checked:  Lab Results  Component Value Date   CHOL 253 (H) 11/17/2023   HDL 62 11/17/2023   LDLCALC 181 (H) 11/17/2023   TRIG 50 11/17/2023   CHOLHDL 4.1 11/17/2023   Lab Results  Component Value Date   HGBA1C 4.9 11/17/2023   Glucose 96    Chemistry      Component Value Date/Time   NA 135 11/17/2023 1546   K 3.3 (L) 11/17/2023 1546   CL 98 11/17/2023 1546   CO2 20 (L) 11/17/2023 1546   BUN 10 11/17/2023 1546   CREATININE 0.71 11/17/2023 1546   CREATININE 0.75 08/26/2016 0953      Component Value Date/Time   CALCIUM 9.9 11/17/2023 1546   ALKPHOS 79 11/17/2023 1546   AST 28 11/17/2023 1546   ALT 24 11/17/2023 1546   BILITOT 0.6 11/17/2023 1546     Lab Results  Component Value Date   WBC 12.8 (H) 11/17/2023   HGB 13.7 11/17/2023   HCT 40.3 11/17/2023   MCV 90.2 11/17/2023   PLT 467 (H) 11/17/2023   Lipid panel is significantly higher than the one done in 06/2015: Component Ref Range & Units (hover) 8 yr ago  Cholesterol 124 Low   Triglycerides 38 Low   HDL 47  Total CHOL/HDL Ratio 2.6  VLDL 8  LDL Cholesterol 69    Lab Results  Component Value Date   TSH 1.709 11/17/2023   Urine drug screen + for marijuana, rest negative. Negative pregnancy test per notes.   Current diet     PMH, PSH, SH reviewed   ROS:    PHYSICAL EXAM:  There were no vitals taken for  this visit.  Wt Readings from Last 3 Encounters:  04/29/22 177 lb (80.3 kg)  03/26/21 175 lb 12.8 oz (79.7 kg)  02/19/21 173 lb 6.4 oz (78.7 kg)        ASSESSMENT/PLAN:  She has appt with Dr. Abbe Amsterdam in 05/2024. 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 Is she transferring care??  Pure hypercholes Hypokalemia Elevated WBC and platelet count (mild)  Cbc, b-met,

## 2023-12-04 ENCOUNTER — Ambulatory Visit (INDEPENDENT_AMBULATORY_CARE_PROVIDER_SITE_OTHER): Payer: Self-pay | Admitting: Family Medicine

## 2023-12-04 ENCOUNTER — Encounter: Payer: Self-pay | Admitting: Family Medicine

## 2023-12-04 VITALS — BP 110/70 | HR 100 | Ht 61.0 in | Wt 150.8 lb

## 2023-12-04 DIAGNOSIS — E876 Hypokalemia: Secondary | ICD-10-CM

## 2023-12-04 DIAGNOSIS — E7800 Pure hypercholesterolemia, unspecified: Secondary | ICD-10-CM

## 2023-12-04 DIAGNOSIS — D72829 Elevated white blood cell count, unspecified: Secondary | ICD-10-CM

## 2023-12-04 DIAGNOSIS — F419 Anxiety disorder, unspecified: Secondary | ICD-10-CM

## 2023-12-04 NOTE — Patient Instructions (Signed)
Cut back on use of butter.  Use olive oil with cooking instead (or steaming). Cut back on beef and pork. Eat more chicken and fish. Avoid/ limit egg yolks (the whites are fine). Continue to drink almond milk. Try and avoid mayonnaise (look at the labels on what you use). Use broth-based soups rather than creamy soups. Try and use more tomato sauces, rather than alfredo or other creamy sauces. Try and use vinaigrette dressings over creamy dressings.

## 2023-12-05 ENCOUNTER — Encounter: Payer: Self-pay | Admitting: Family Medicine

## 2023-12-05 LAB — BASIC METABOLIC PANEL
BUN/Creatinine Ratio: 20 (ref 9–23)
BUN: 15 mg/dL (ref 6–20)
CO2: 21 mmol/L (ref 20–29)
Calcium: 9.8 mg/dL (ref 8.7–10.2)
Chloride: 102 mmol/L (ref 96–106)
Creatinine, Ser: 0.74 mg/dL (ref 0.57–1.00)
Glucose: 104 mg/dL — ABNORMAL HIGH (ref 70–99)
Potassium: 4.1 mmol/L (ref 3.5–5.2)
Sodium: 139 mmol/L (ref 134–144)
eGFR: 114 mL/min/{1.73_m2} (ref >59)

## 2023-12-05 LAB — CBC WITH DIFFERENTIAL/PLATELET
Basophils Absolute: 0 10*3/uL (ref 0.0–0.2)
Basos: 1 %
EOS (ABSOLUTE): 0.1 10*3/uL (ref 0.0–0.4)
Eos: 1 %
Hematocrit: 39.1 % (ref 34.0–46.6)
Hemoglobin: 13.1 g/dL (ref 11.1–15.9)
Immature Grans (Abs): 0 10*3/uL (ref 0.0–0.1)
Immature Granulocytes: 0 %
Lymphocytes Absolute: 1.8 10*3/uL (ref 0.7–3.1)
Lymphs: 23 %
MCH: 30.6 pg (ref 26.6–33.0)
MCHC: 33.5 g/dL (ref 31.5–35.7)
MCV: 91 fL (ref 79–97)
Monocytes Absolute: 0.5 10*3/uL (ref 0.1–0.9)
Monocytes: 7 %
Neutrophils Absolute: 5.5 10*3/uL (ref 1.4–7.0)
Neutrophils: 68 %
Platelets: 399 10*3/uL (ref 150–450)
RBC: 4.28 x10E6/uL (ref 3.77–5.28)
RDW: 12.6 % (ref 11.7–15.4)
WBC: 8 10*3/uL (ref 3.4–10.8)

## 2023-12-05 NOTE — Progress Notes (Signed)
Sent to Dr. Evelene Croon.

## 2023-12-07 NOTE — Progress Notes (Unsigned)
No chief complaint on file.  Patient was seen last week to review abnormal labs done at [facility]. After visit was over, she reported to the nurse about wanting reassurance about abdominal pain, which was NOT mentioned during visit. She presents today to discuss her concerns.        PMH, PSH, SH reviewed   ROS:    PHYSICAL EXAM:  There were no vitals taken for this visit.      ASSESSMENT/PLAN:

## 2023-12-08 ENCOUNTER — Encounter: Payer: Self-pay | Admitting: Family Medicine

## 2023-12-08 ENCOUNTER — Ambulatory Visit: Payer: Self-pay | Admitting: Family Medicine

## 2023-12-08 VITALS — BP 118/74 | HR 84 | Temp 99.1°F | Ht 61.0 in | Wt 151.6 lb

## 2023-12-08 DIAGNOSIS — F419 Anxiety disorder, unspecified: Secondary | ICD-10-CM

## 2023-12-08 DIAGNOSIS — R112 Nausea with vomiting, unspecified: Secondary | ICD-10-CM

## 2023-12-08 DIAGNOSIS — K5900 Constipation, unspecified: Secondary | ICD-10-CM

## 2023-12-08 DIAGNOSIS — R1084 Generalized abdominal pain: Secondary | ICD-10-CM

## 2023-12-08 LAB — POCT URINALYSIS DIP (PROADVANTAGE DEVICE)
Bilirubin, UA: NEGATIVE
Blood, UA: NEGATIVE
Glucose, UA: NEGATIVE mg/dL
Ketones, POC UA: NEGATIVE mg/dL
Leukocytes, UA: NEGATIVE
Nitrite, UA: NEGATIVE
Protein Ur, POC: NEGATIVE mg/dL
Specific Gravity, Urine: 1.01
Urobilinogen, Ur: 0.2
pH, UA: 7.5 (ref 5.0–8.0)

## 2023-12-08 NOTE — Patient Instructions (Signed)
Try and eat small frequent meals (rather than large meals). Wait at least 2 hours after eating before laying down/reclining.. See handout for details, but in general, try and avoid spicy foods, caffeine, citrus, tomatoes, and alcohol.  Take omeprazole 20 mg (ie Prilosec OTC generic) once daily with dinner. If you continue to have nausea, vomiting and upper stomach pain, you can double up and take 1 pill twice daily for up to 2-4 weeks, then back down to just 1 pill daily. Ideally, once you are feeling better (regarding nausea, vomiting) you can stop taking this medication to see if the dietary changes are helping.  Ultimately you can try using the medication just prior to triggering meals.  And if you're doing really well, and you are out of the prilosec, you can even try famotidine (pepcid) which might work just as well for using it as needed.  You have a low grade fever today--I don't think this is related to your stomach.  I hope you aren't coming down with something (like your sister shared a germ with you). If you develop high fever, worsening abdominal pain, persistent vomiting, any coffee-ground appearance to your vomit, or black or bloody stools, these are reasons you need to be re-evaluated right away.  Be sure to drink 6-8 glasses of water daily (or more), eat a high fiber diet, and consider a fiber supplement (such as Benefiber) if your bowels remain somewhat constipated.  Follow up in 2-4 weeks if not better (sooner if worse).

## 2024-03-02 ENCOUNTER — Other Ambulatory Visit: Payer: Self-pay

## 2024-03-02 DIAGNOSIS — E7800 Pure hypercholesterolemia, unspecified: Secondary | ICD-10-CM

## 2024-03-03 ENCOUNTER — Ambulatory Visit: Payer: Self-pay | Admitting: Family Medicine

## 2024-03-03 LAB — LIPID PANEL
Chol/HDL Ratio: 3.5 ratio (ref 0.0–4.4)
Cholesterol, Total: 194 mg/dL (ref 100–199)
HDL: 56 mg/dL (ref >39)
LDL Chol Calc (NIH): 129 mg/dL — ABNORMAL HIGH (ref 0–99)
Triglycerides: 49 mg/dL (ref 0–149)
VLDL Cholesterol Cal: 9 mg/dL (ref 5–40)

## 2024-06-10 ENCOUNTER — Encounter: Payer: Self-pay | Admitting: Family Medicine

## 2024-06-10 ENCOUNTER — Ambulatory Visit: Payer: Self-pay | Admitting: Family Medicine

## 2024-06-10 VITALS — BP 122/80 | Temp 98.8°F | Ht 61.0 in | Wt 193.4 lb

## 2024-06-10 DIAGNOSIS — F419 Anxiety disorder, unspecified: Secondary | ICD-10-CM

## 2024-06-10 DIAGNOSIS — F339 Major depressive disorder, recurrent, unspecified: Secondary | ICD-10-CM

## 2024-06-10 DIAGNOSIS — Z7689 Persons encountering health services in other specified circumstances: Secondary | ICD-10-CM

## 2024-06-10 DIAGNOSIS — Z30011 Encounter for initial prescription of contraceptive pills: Secondary | ICD-10-CM | POA: Diagnosis not present

## 2024-06-10 MED ORDER — NORETHINDRONE 0.35 MG PO TABS: 1.0000 | ORAL_TABLET | Freq: Every day | ORAL | 6 refills | Status: AC

## 2024-06-10 NOTE — Patient Instructions (Addendum)
 It was nice meeting you today.  You can schedule a physical at your convenience.   Here is a list of some of the area OB/Gyn providers.  It is not an all inclusive list but should help you get started.  [facility] -Ovid All, MD  -Jerolyn Foil, MD - Jon Rummer, MD -Will Rigg, MD   [facility] -Lavonia Guppy, MD -[place], DO  [facility] -Rexene Hoit, MD  -Eleanor Jury, DO  [facility] -Rosaline Chapel, MD -Jolene Gaskins, MD  [facility] Robbi Render, MD  [facility] Hobert Quarry, MD  Elvie Pinal, MD ---only does gynecology now, not obstetrics.

## 2024-06-10 NOTE — Progress Notes (Signed)
 Established Patient Office Visit   Subjective  Patient ID: Kelli Norton, female    DOB: 1995-12-09  Age: 28 y.o. MRN: [id number]  Chief Complaint  Patient presents with   New Patient (Initial Visit)    Pt is a 28 year old female seen for establish care and follow-up on ongoing concerns.  Previously followed by Annabelle Fetters, MD.  Also seen by OB/GYN however they no longer accept her insurance.  Taking aripiprazole for depression, times three months, with a positive response in her symptoms. Prior to starting aripiprazole, she was not on any other medication for depression.  Followed by psychiatry, Dr. Vincente.  Taking hydroxyzine for anxiety, which she finds helpful.  Notes general anxiety and panic attacks in the past. She describes her anxiety as 'constant worrying and just feeling like I'm on the go all the time,' and notes feeling very hyper. Her anxiety was exacerbated by her time in grad school.   She reports some sleep disturbances, waking up around 3 AM but is able to return to sleep within 30 minutes to an hour. She typically goes to bed around 10:30 to 11 PM and wakes up at 6:45 to 7 AM. She drinks coffee daily but not late in the day.  She is taking norethindrone  for birth control and is seeking a refill. Her previous OB GYN does not accept her insurance, and she is in the process of finding a new provider.  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 drinks alcohol occasionally and does not use tobacco or drugs.  Family medical history: Maternal aunt with diabetes and high blood pressure, maternal grandmother with a history of two strokes, and paternal grandmother with COPD.    There are no active problems to display for this patient.  Past Medical History:  Diagnosis Date   Allergy     Anxiety    Chlamydia 08/2020, 12/2020   Depression    Finger fracture, right 2012   right index, splinted   History reviewed. No pertinent surgical history. Social History   Tobacco Use   Smoking status: Never   Smokeless tobacco: Never  Vaping Use   Vaping status: Never Used  Substance Use Topics   Alcohol use: Not Currently    Comment: infrequent (1-2x/month)   Drug use: No   Family History  Problem Relation Age of Onset   Hypertension Maternal Aunt    Hypertension Maternal Grandmother    Stroke Maternal Grandmother    Heart disease Neg Hx    Diabetes Neg Hx    Cancer Neg Hx    No Known Allergies  ROS Negative unless stated above    Objective:     BP 122/80 (BP Location: Left Arm, Patient Position: Sitting, Cuff Size: Normal)   Temp 98.8 F (37.1 C) (Oral)   Ht 5' 1 (1.549 m)   Wt 193 lb 6.4 oz (87.7 kg)   LMP 05/22/2024 (Exact Date)   BMI 36.54 kg/m  BP Readings from Last 3 Encounters:  06/10/24 122/80  12/08/23 118/74  12/04/23 110/70   Wt Readings from Last 3 Encounters:  06/10/24 193 lb 6.4 oz (87.7 kg)  12/08/23 151 lb 9.6 oz (68.8 kg)  12/04/23 150 lb 12.8 oz (68.4 kg)      Physical Exam Constitutional:      General: She is not  in acute distress.    Appearance: Normal appearance.  HENT:     Head: Normocephalic and atraumatic.     Nose: Nose normal.     Mouth/Throat:     Mouth: Mucous membranes are moist.  Cardiovascular:     Rate and Rhythm: Normal rate and regular rhythm.     Heart sounds: Normal heart sounds. No murmur heard.    No gallop.  Pulmonary:     Effort: Pulmonary effort is normal. No respiratory distress.     Breath sounds: Normal breath sounds. No wheezing, rhonchi or rales.  Skin:    General: Skin is warm and dry.  Neurological:     Mental Status: She is alert and oriented to person, place, and time.        06/10/2024    2:36 PM 04/29/2022    3:21 PM 02/19/2021   11:16 AM  Depression screen PHQ 2/9  Decreased  Interest 0 0 0  Down, Depressed, Hopeless 0 0 0  PHQ - 2 Score 0 0 0  Altered sleeping 1    Tired, decreased energy 1    Change in appetite 1    Feeling bad or failure about yourself  0    Trouble concentrating 0    Moving slowly or fidgety/restless 0    Suicidal thoughts 0    PHQ-9 Score 3    Difficult doing work/chores Not difficult at all        06/10/2024    2:36 PM  GAD 7 : Generalized Anxiety Score  Nervous, Anxious, on Edge 0  Control/stop worrying 0  Worry too much - different things 0  Trouble relaxing 0  Restless 0  Easily annoyed or irritable 0  Afraid - awful might happen 0  Total GAD 7 Score 0  Anxiety Difficulty Not difficult at all     No results found for any visits on 06/10/24.    Assessment & Plan:   Encounter for initial prescription of contraceptive pills -     Norethindrone ; Take 1 tablet (0.35 mg total) by mouth daily.  Dispense: 28 tablet; Refill: 6  Encounter to establish care  Anxiety  Depression, recurrent (HCC)  Anxiety and depression well-controlled on current regimen.  GAD-7 score 0, PHQ-9 score 3 this visit.  Continue Abilify 10 mg daily and hydroxyzine 25 mg as needed.  Continue follow-up with psychiatry, Dr. Vincente.  OCPs refilled.  Discussed the importance of consistent use to prevent pregnancy but not STIs.  Given information about finding a new OB/GYN provider.  -We reviewed the PMH, PSH, FH, SH, Meds and Allergies. -We provided refills for any medications we will prescribe as needed. -We addressed current concerns per orders and patient instructions. -We have asked for records for pertinent exams, studies, vaccines and notes from previous providers. -We have advised patient to follow up per instructions below.   Return As needed.   Kelli JONELLE Single, MD
